# Patient Record
Sex: Male | Born: 1960 | Race: White | Hispanic: No | Marital: Married | Smoking: Never smoker
Health system: Southern US, Community
[De-identification: ages and names within clinical notes are randomized; demographics above are authoritative.]

## PROBLEM LIST (undated history)

## (undated) DIAGNOSIS — K5090 Crohn's disease, unspecified, without complications: Secondary | ICD-10-CM

## (undated) HISTORY — PX: ABDOMINAL SURGERY: SHX537

---

## 2004-06-23 ENCOUNTER — Encounter (INDEPENDENT_AMBULATORY_CARE_PROVIDER_SITE_OTHER): Payer: Self-pay | Admitting: Specialist

## 2004-06-23 ENCOUNTER — Ambulatory Visit (HOSPITAL_COMMUNITY): Admission: RE | Admit: 2004-06-23 | Discharge: 2004-06-23 | Payer: Self-pay | Admitting: Gastroenterology

## 2004-07-14 ENCOUNTER — Ambulatory Visit (HOSPITAL_COMMUNITY): Admission: RE | Admit: 2004-07-14 | Discharge: 2004-07-14 | Payer: Self-pay | Admitting: *Deleted

## 2004-10-15 ENCOUNTER — Encounter (HOSPITAL_COMMUNITY): Admission: RE | Admit: 2004-10-15 | Discharge: 2005-01-13 | Payer: Self-pay | Admitting: Gastroenterology

## 2005-01-14 ENCOUNTER — Encounter (HOSPITAL_COMMUNITY): Admission: RE | Admit: 2005-01-14 | Discharge: 2005-04-14 | Payer: Self-pay | Admitting: Gastroenterology

## 2005-06-29 ENCOUNTER — Encounter (HOSPITAL_COMMUNITY): Admission: RE | Admit: 2005-06-29 | Discharge: 2005-09-27 | Payer: Self-pay | Admitting: Gastroenterology

## 2005-09-30 ENCOUNTER — Encounter (HOSPITAL_COMMUNITY): Admission: RE | Admit: 2005-09-30 | Discharge: 2005-12-29 | Payer: Self-pay | Admitting: Gastroenterology

## 2006-03-12 ENCOUNTER — Encounter (HOSPITAL_COMMUNITY): Admission: RE | Admit: 2006-03-12 | Discharge: 2006-06-10 | Payer: Self-pay | Admitting: Gastroenterology

## 2006-08-23 ENCOUNTER — Encounter (HOSPITAL_COMMUNITY): Admission: RE | Admit: 2006-08-23 | Discharge: 2006-08-23 | Payer: Self-pay | Admitting: Gastroenterology

## 2006-10-25 ENCOUNTER — Encounter (HOSPITAL_COMMUNITY): Admission: RE | Admit: 2006-10-25 | Discharge: 2006-12-15 | Payer: Self-pay | Admitting: Nephrology

## 2007-01-18 ENCOUNTER — Encounter (HOSPITAL_COMMUNITY): Admission: RE | Admit: 2007-01-18 | Discharge: 2007-01-18 | Payer: Self-pay | Admitting: Gastroenterology

## 2007-03-12 ENCOUNTER — Emergency Department (HOSPITAL_COMMUNITY): Admission: EM | Admit: 2007-03-12 | Discharge: 2007-03-12 | Payer: Self-pay | Admitting: Emergency Medicine

## 2007-04-12 ENCOUNTER — Encounter (HOSPITAL_COMMUNITY): Admission: RE | Admit: 2007-04-12 | Discharge: 2007-04-12 | Payer: Self-pay | Admitting: Gastroenterology

## 2007-07-05 ENCOUNTER — Encounter (HOSPITAL_COMMUNITY): Admission: RE | Admit: 2007-07-05 | Discharge: 2007-09-14 | Payer: Self-pay | Admitting: Gastroenterology

## 2007-09-27 ENCOUNTER — Encounter (HOSPITAL_COMMUNITY): Admission: RE | Admit: 2007-09-27 | Discharge: 2007-11-16 | Payer: Self-pay | Admitting: Gastroenterology

## 2007-12-14 ENCOUNTER — Encounter (HOSPITAL_COMMUNITY): Admission: RE | Admit: 2007-12-14 | Discharge: 2008-01-12 | Payer: Self-pay | Admitting: Gastroenterology

## 2008-03-16 ENCOUNTER — Encounter (HOSPITAL_COMMUNITY): Admission: RE | Admit: 2008-03-16 | Discharge: 2008-06-14 | Payer: Self-pay | Admitting: Gastroenterology

## 2008-07-17 ENCOUNTER — Encounter (HOSPITAL_COMMUNITY): Admission: RE | Admit: 2008-07-17 | Discharge: 2008-10-05 | Payer: Self-pay | Admitting: *Deleted

## 2008-10-17 ENCOUNTER — Encounter: Admission: RE | Admit: 2008-10-17 | Discharge: 2009-01-15 | Payer: Self-pay | Admitting: Gastroenterology

## 2009-01-17 ENCOUNTER — Encounter (HOSPITAL_COMMUNITY): Admission: RE | Admit: 2009-01-17 | Discharge: 2009-04-17 | Payer: Self-pay | Admitting: *Deleted

## 2009-04-30 ENCOUNTER — Encounter (HOSPITAL_COMMUNITY): Admission: RE | Admit: 2009-04-30 | Discharge: 2009-07-29 | Payer: Self-pay | Admitting: Gastroenterology

## 2009-07-30 ENCOUNTER — Encounter (HOSPITAL_COMMUNITY)
Admission: RE | Admit: 2009-07-30 | Discharge: 2009-10-28 | Payer: Self-pay | Source: Home / Self Care | Admitting: Gastroenterology

## 2010-01-15 ENCOUNTER — Encounter (HOSPITAL_COMMUNITY)
Admission: RE | Admit: 2010-01-15 | Discharge: 2010-02-11 | Payer: Self-pay | Source: Home / Self Care | Attending: Gastroenterology | Admitting: Gastroenterology

## 2010-02-28 ENCOUNTER — Emergency Department (HOSPITAL_COMMUNITY): Payer: Self-pay

## 2010-02-28 ENCOUNTER — Inpatient Hospital Stay (HOSPITAL_COMMUNITY)
Admission: EM | Admit: 2010-02-28 | Discharge: 2010-03-06 | DRG: 386 | Disposition: A | Discharge status: Home / Self Care | Payer: Self-pay | Attending: Internal Medicine | Admitting: Internal Medicine

## 2010-02-28 DIAGNOSIS — K5000 Crohn's disease of small intestine without complications: Principal | ICD-10-CM | POA: Diagnosis present

## 2010-02-28 DIAGNOSIS — G4700 Insomnia, unspecified: Secondary | ICD-10-CM | POA: Diagnosis present

## 2010-02-28 DIAGNOSIS — F411 Generalized anxiety disorder: Secondary | ICD-10-CM | POA: Diagnosis present

## 2010-02-28 DIAGNOSIS — K56609 Unspecified intestinal obstruction, unspecified as to partial versus complete obstruction: Secondary | ICD-10-CM | POA: Diagnosis present

## 2010-02-28 DIAGNOSIS — M1990 Unspecified osteoarthritis, unspecified site: Secondary | ICD-10-CM | POA: Diagnosis present

## 2010-02-28 LAB — CBC
MCH: 31.1 pg (ref 26.0–34.0)
MCHC: 34.2 g/dL (ref 30.0–36.0)
Platelets: 198 10*3/uL (ref 150–400)
RDW: 13.4 % (ref 11.5–15.5)

## 2010-02-28 LAB — COMPREHENSIVE METABOLIC PANEL
AST: 16 U/L (ref 0–37)
Albumin: 3.5 g/dL (ref 3.5–5.2)
Calcium: 9 mg/dL (ref 8.4–10.5)
Creatinine, Ser: 0.92 mg/dL (ref 0.4–1.5)
GFR calc Af Amer: >60 mL/min (ref >60)
GFR calc non Af Amer: >60 mL/min (ref >60)
Sodium: 141 mEq/L (ref 135–145)
Total Protein: 6.8 g/dL (ref 6.0–8.3)

## 2010-02-28 LAB — DIFFERENTIAL
Basophils Absolute: 0 10*3/uL (ref 0.0–0.1)
Basophils Relative: 0 % (ref 0–1)
Eosinophils Absolute: 0 10*3/uL (ref 0.0–0.7)
Monocytes Absolute: 0.9 10*3/uL (ref 0.1–1.0)
Monocytes Relative: 7 % (ref 3–12)

## 2010-02-28 MED ORDER — IOHEXOL 300 MG/ML  SOLN: 100.0000 mL | Freq: Once | INTRAMUSCULAR | Status: AC | PRN

## 2010-03-01 LAB — COMPREHENSIVE METABOLIC PANEL
ALT: 15 U/L (ref 0–53)
Alkaline Phosphatase: 48 U/L (ref 39–117)
CO2: 27 mEq/L (ref 19–32)
Calcium: 8.6 mg/dL (ref 8.4–10.5)
GFR calc non Af Amer: >60 mL/min (ref >60)
Glucose, Bld: 102 mg/dL — ABNORMAL HIGH (ref 70–99)
Sodium: 140 mEq/L (ref 135–145)

## 2010-03-01 LAB — CBC
HCT: 42.4 % (ref 39.0–52.0)
Hemoglobin: 14 g/dL (ref 13.0–17.0)
MCHC: 33 g/dL (ref 30.0–36.0)
MCV: 91.6 fL (ref 78.0–100.0)

## 2010-03-01 LAB — DIFFERENTIAL
Basophils Absolute: 0 10*3/uL (ref 0.0–0.1)
Lymphocytes Relative: 27 % (ref 12–46)
Lymphs Abs: 3 10*3/uL (ref 0.7–4.0)
Monocytes Absolute: 0.9 10*3/uL (ref 0.1–1.0)
Monocytes Relative: 8 % (ref 3–12)
Neutro Abs: 7.3 10*3/uL (ref 1.7–7.7)

## 2010-03-01 LAB — MAGNESIUM: Magnesium: 2.1 mg/dL (ref 1.5–2.5)

## 2010-03-02 ENCOUNTER — Inpatient Hospital Stay (HOSPITAL_COMMUNITY): Payer: Self-pay

## 2010-03-02 LAB — CBC
HCT: 42 % (ref 39.0–52.0)
Hemoglobin: 14.2 g/dL (ref 13.0–17.0)
MCH: 30.3 pg (ref 26.0–34.0)
MCHC: 33.8 g/dL (ref 30.0–36.0)

## 2010-03-02 LAB — BASIC METABOLIC PANEL
CO2: 24 mEq/L (ref 19–32)
Calcium: 9.1 mg/dL (ref 8.4–10.5)
Creatinine, Ser: 0.99 mg/dL (ref 0.4–1.5)
Glucose, Bld: 115 mg/dL — ABNORMAL HIGH (ref 70–99)

## 2010-03-03 LAB — CBC
MCH: 30.2 pg (ref 26.0–34.0)
MCV: 89.6 fL (ref 78.0–100.0)
Platelets: 217 10*3/uL (ref 150–400)
RDW: 13 % (ref 11.5–15.5)

## 2010-03-03 LAB — BASIC METABOLIC PANEL
BUN: 11 mg/dL (ref 6–23)
Chloride: 108 mEq/L (ref 96–112)
Creatinine, Ser: 0.9 mg/dL (ref 0.4–1.5)
GFR calc non Af Amer: >60 mL/min (ref >60)

## 2010-03-04 LAB — BASIC METABOLIC PANEL WITH GFR
BUN: 11 mg/dL (ref 6–23)
CO2: 25 meq/L (ref 19–32)
Calcium: 9 mg/dL (ref 8.4–10.5)
Chloride: 107 meq/L (ref 96–112)
Creatinine, Ser: 0.94 mg/dL (ref 0.4–1.5)
GFR calc non Af Amer: >60 mL/min
Glucose, Bld: 144 mg/dL — ABNORMAL HIGH (ref 70–99)
Potassium: 3.7 meq/L (ref 3.5–5.1)
Sodium: 138 meq/L (ref 135–145)

## 2010-03-04 LAB — CBC
HCT: 40.4 % (ref 39.0–52.0)
Hemoglobin: 13.5 g/dL (ref 13.0–17.0)
MCH: 30.3 pg (ref 26.0–34.0)
MCHC: 33.4 g/dL (ref 30.0–36.0)
MCV: 90.6 fL (ref 78.0–100.0)
Platelets: 208 K/uL (ref 150–400)
RBC: 4.46 MIL/uL (ref 4.22–5.81)
RDW: 13.1 % (ref 11.5–15.5)
WBC: 14.5 K/uL — ABNORMAL HIGH (ref 4.0–10.5)

## 2010-03-05 LAB — BASIC METABOLIC PANEL
GFR calc Af Amer: >60 mL/min (ref >60)
GFR calc non Af Amer: >60 mL/min (ref >60)
Potassium: 4.1 mEq/L (ref 3.5–5.1)
Sodium: 144 mEq/L (ref 135–145)

## 2010-03-05 LAB — CBC
HCT: 40.4 % (ref 39.0–52.0)
Platelets: 193 10*3/uL (ref 150–400)
RDW: 12.9 % (ref 11.5–15.5)
WBC: 12.5 10*3/uL — ABNORMAL HIGH (ref 4.0–10.5)

## 2010-03-16 NOTE — H&P (Signed)
Isaac Mckenzie, Isaac Mckenzie                ACCOUNT NO.:  [id number]  MEDICAL RECORD NO.:  [id number]           PATIENT TYPE:  E  LOCATION:  [facility]                         FACILITY:  [facility]  PHYSICIAN:  Michiel Cowboy, MDDATE OF BIRTH:  December 15, 1960  DATE OF ADMISSION:  02/28/2010 DATE OF DISCHARGE:                             HISTORY & PHYSICAL   DATE OF ADMISSION:  February 28, 2010  PRIMARY CARE PROVIDER:  Otilio Connors. Gerri Spore, MD  CHIEF COMPLAINT:  Nausea, vomiting or abdominal pain.  HISTORY:  The patient is a 50 year old gentleman with a history of Crohn disease who has had recurrent small bowel obstructions in the past, has had required 2 surgeries to release small bowel obstruction, both times small part of his small bowel were removed.  For the past few days, he had been having worsening symptoms of abdominal pain, decreased bowel movements.  Actually, he feels like he has not had any bowel movements in the past 3 days.  He also have had decreased p.o. intake, nausea and vomiting.  He was not able to take anything to drink or eat.  REVIEW OF SYSTEMS:  The patient denies any fevers or chills.  No current chest pains, although he states that rarely he gets occasional chest pains, which he cannot really qualify.  He has otherwise review of systems only significant for HPI.  SOCIAL HISTORY:  The patient is a former smoke.  Does not drink or abuse drugs.  FAMILY HISTORY:  Noncontributory.  ALLERGIES:  IBUPROFEN and MORPHINE causes hives.  PAST MEDICAL HISTORY:  Significant for: 1. Crohn's. 2. Neck arthritis. 3. 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  MEDICATIONS: 1. Hydrocodone/acetaminophen as needed for pain. 2. Clonazepam 0.5 mg at night. 3. Remicade, he gets it every 3 months. 4. Pentasa.  He is supposed to take 2 caps 500 mg each 4 times a day,     only takes it when he has flares because he does not have enough     insurance to  pay for it. 5. He also has prescription for Flagyl as needed for when he has     flares.  PHYSICAL EXAMINATION:  VITALS:  Temperature 98.5, blood pressure 117/92, pulse 79, respiration 20, satting 97% on room air. GENERAL:  The patient appears to be in no acute distress. HEENT:  Head; nontraumatic.  Moist mucous membranes. LUNGS:  Clear to auscultation bilaterally. HEART:  Regular rate rhythm.  No murmurs appreciated. ABDOMEN:  Some central tenderness present.  No rebound tenderness noted. Decreased bowel movements throughout. LOWER EXTREMITIES:  Without clubbing, cyanosis, or edema. NEUROLOGIC:  Grossly intact.  LABORATORY DATA:  White blood cell count 11.9, hemoglobin 14.4.  Sodium 141, potassium 3.8, creatinine 0.92.  LFTs within normal limits.  Lipase within normal limits.  KUB showing mild small bowel obstruction.  ASSESSMENT AND PLAN:  This is a pleasant 50 year old gentleman with partial small bowel obstruction and a history of Crohn disease.  Partial small bowel obstruction.  We will admit.  We will also order a CT scan of abdomen and pelvis try  to clarify this a little bit better. He probably will need at some point surgical consult, especially if this does not get better with just conservative management as he has required surgery in the past.  Right now, we will do a bowel rest and pain management.  History of Crohn's.  I am not sure if he has some Crohn's exacerbation component to it.  Right now, we will cover with Cipro and Flagyl.  Prophylaxis, Protonix and SCDs.     Michiel Cowboy, MD     AVD/MEDQ  D:  02/28/2010  T:  02/28/2010  Job:  [id number]  cc:   Otilio Connors. Gerri Spore, M.D. _____________ Dr. Randa Evens  Electronically Signed by Therisa Doyne MD on 03/16/2010 10:08:44 PM

## 2010-03-26 ENCOUNTER — Encounter (HOSPITAL_COMMUNITY): Payer: Self-pay

## 2010-03-28 NOTE — Op Note (Signed)
  NAMEPELHAM, HENNICK                ACCOUNT NO.:  [id number]  MEDICAL RECORD NO.:  [id number]           PATIENT TYPE:  LOCATION:                                 FACILITY:  PHYSICIAN:  Victoriana Aziz L. Malon Kindle., M.D.DATE OF BIRTH:  [birth year]  DATE OF PROCEDURE:  03/05/2010 DATE OF DISCHARGE:                              OPERATIVE REPORT   PROCEDURE:  Colonoscopy.  MEDICATIONS: 1. Benadryl 50 mg. 2. Fentanyl 150 mcg. 3. Versed 12 mg IV.  INDICATIONS:  A 50 year old gentleman with a long history of Crohn's, has had some sort of surgery.  Colonoscopy a number of years ago showed what appeared to be an inflammatory mass.  It was not clear if this was ileocecal valve or what.  He has not had a colonoscopy in the past several years.  He was admitted with a CT showing active small bowel Crohn's that is slowly improving, this is done to look for activity in the ileocecal area.  DESCRIPTION OF PROCEDURE:  Procedure explained to the patient, consent obtained.  In the left lateral decubitus position, the Pentax scope was inserted and advanced.  We easily advanced to the right colon. At this point, there was no active Crohn's.  There were a few scattered diverticula.  The anastomosis was clearly seen, and there was no evidence of an inflammatory mass or ileocecal valve.  It had the appearance of end-to-side ileocolonic anastomosis.  At this point, I suspect he has had at least a partial right colectomy.  I tried to get through this area but it was somewhat narrowed, but we were unable to pass the scope.  It was not inflamed.  Gentle pressure were unable to pass but it did appear to be patent.  The scope was withdrawn and mucosa carefully examined.  Other than a few scattered diverticula, there was no active Crohn's at all throughout the colon.  The patient tolerated the procedure well.  ASSESSMENT:  No active Crohn disease in the colon.  The CT suggests small bowel Crohn's.  The patient  apparently has had a previous right colectomy with an ileocolonic anastomosis.  PLAN:  We will slowly advance diet.  Hopefully, we will be able to discharge him on oral medications with chronic IV Remicade.          ______________________________ Llana Aliment. Malon Kindle., M.D.     Waldron Session  D:  03/05/2010  T:  03/05/2010  Job:  [id number]  Electronically Signed by Carman Ching M.D. on 03/28/2010 09:25:28 AM

## 2010-04-02 NOTE — Discharge Summary (Signed)
Isaac Mckenzie, Isaac Mckenzie                ACCOUNT NO.:  [id number]  MEDICAL RECORD NO.:  [id number]           PATIENT TYPE:  I  LOCATION:  5149                         FACILITY:  [facility]  PHYSICIAN:  Isaac Llano, MD       DATE OF BIRTH:  30-Mar-1960  DATE OF ADMISSION:  02/28/2010 DATE OF DISCHARGE:  03/06/2010                              DISCHARGE SUMMARY   PRIMARY CARE PHYSICIAN:  Carola J. Gerri Spore, MD  PRIMARY GASTROENTEROLOGIST:  Isaac Aliment. Randa Evens, MD  REASON FOR ADMISSION:  Nausea, vomiting, abdominal pain.  DISCHARGE DIAGNOSES: 1. Crohn disease exacerbation with terminal ileitis. 2. Partial small bowel obstruction, resolved, secondary to Crohn     disease flare-up. 3. Anxiety. 4. Insomnia, resolved. 5. Neck arthritis. 6. History of recurrent small bowel obstruction and history of     fistulae.  DISCHARGE MEDICATIONS: 1. Xanax 0.5 mg by mouth b.i.d. as needed for anxiety. 2. Prednisone 10 mg, take 3 tablets p.o. b.i.d. 3. Clonazepam 0.5 to 1 mg daily at bedtime as needed to help with     sleep. 4. Fish oil OTC 1 tablet every morning as needed. 5. Hydrocodone/APAP 10/500 mg 1 tablet p.o. b.i.d. as needed for pain. 6. Metronidazole one tablet 3 daily as needed with Crohn disease flare-     ups 7. Pentasa 500 mg 2 capsules p.o. b.i.d. 8. Remicade IV every 3 months.  RADIOLOGY: 1. Abdominal x-ray, March 02, 2010, showed decrease in caliber in     number of previously dilated mid bowel.  Abdominal loops suggest     improvement of small bowel obstruction. 2. CT scan of abdomen and pelvis, February 28, 2010, showed wall     thickening within the terminal ileum, dilated mid and distal small     bowel loops compatible with associated mild small bowel     obstruction.  There was sigmoid diverticulosis. 3. Colonoscopy done Dr. Carman Mckenzie on March 05, 2010, showed no     active Crohn disease in the colon.  The CT suggests small bowel     Crohn's.  The patient apparently  had previous right colectomy with     ileocolonic anastomosis.  Continue on chronic IV Remicade, oral     Pentasa, and advance diet.  BRIEF HISTORY EXAMINATION:  Isaac Mckenzie is a 50 year old gentleman with a history of colon disease who had recurrent small bowel obstruction in the past secondary to Crohn disease exacerbation.  The patient required two surgeries to release small bowel obstruction, both times parts of small bowel were removed.  For the past few days prior to admission, he had been having worsening of symptoms of abdominal pain, decreased bowel movement, actually feel like he has not had bowel movement in the past 3 days prior to admission.  He also had decreased p.o. intake, nausea, vomiting.  The patient admitted to the hospital for further evaluation.  BRIEF HOSPITAL COURSE: 1. Crohn disease exacerbation.  The patient admitted to the hospital.     GI was consulted and the patient was started on his IV Remicade as     well as IV steroids.  Soon that was switched to oral steroids.  Dr.     Randa Mckenzie as mentioned above did colonoscopy which showed no active     Crohn disease in the colon and recommended to switch to oral     steroids and advance as tolerated.  The patient's small bowel     obstruction was getting better and the patient seems safe to go     home after he was eating and small bowel obstruction resolved. 2. Dr. Randa Mckenzie recommend to continue on the Pentasa as well as chronic     Remicade.  Recommended to continue on 30 mg of prednisone b.i.d.     until he sees him in his office. 3. Partial small bowel obstruction, resolved.  The patient was n.p.o.     at the time he came in.  Because of previous small bowel     obstruction that required surgery, it was thought that he is going     to need surgery but he did recover beautifully.  The patient having     bowel movement and his diet was advanced and he did have recovery     from that.  The patient on date of  discharge eating low-residue     diet without any problems, having bowel movement without any     nausea, vomiting, or abdominal cramps. 4. Anxiety.  The patient taking Ativan orally for insomnia, not for     anxiety.  He has a long history of anxiety, so long-acting     benzodiazepine was added.  DISCHARGE INSTRUCTIONS: 1. Activity:  As tolerated. 2. Diet:  Low-residue diet. 3. Disposition:  Home.     Isaac Llano, MD     ME/MEDQ  D:  03/06/2010  T:  03/07/2010  Job:  [id number]  cc:   Isaac Mckenzie, M.D. Isaac Mckenzie., M.D.  Electronically Signed by Isaac Mckenzie  on 04/02/2010 07:47:37 PM

## 2010-04-25 ENCOUNTER — Encounter (HOSPITAL_COMMUNITY): Payer: Self-pay | Attending: Gastroenterology

## 2010-04-25 DIAGNOSIS — K5090 Crohn's disease, unspecified, without complications: Secondary | ICD-10-CM | POA: Insufficient documentation

## 2010-05-30 NOTE — Op Note (Signed)
NAMECORDARRYL, MONRREAL                ACCOUNT NO.:  [id number]   MEDICAL RECORD NO.:  [id number]          PATIENT TYPE:  AMB   LOCATION:  DAY                          FACILITY[facility]  [facility]   PHYSICIAN:  Vikki Ports, MDDATE OF BIRTH:  1960/03/12   DATE OF PROCEDURE:  07/14/2004  DATE OF DISCHARGE:                                 OPERATIVE REPORT   PREOPERATIVE DIAGNOSIS:  History of Crohn's disease and fistula in ano.   POSTOPERATIVE DIAGNOSIS:  History of Crohn's disease and fistula in ano,  transsphincteric.   SURGEON:  Vikki Ports, MD.   PROCEDURE:  Placement of a Surgisis fistula plug and examination under  anesthesia.   ANESTHESIA:  General.   DESCRIPTION:  The patient was taken to the operating room and placed in  supine position.  After adequate general anesthesia was induced, the patient  was placed in lithotomy position, and perianal prep and rectal prep were  undertaken.  A genitorectal probe at the external opening approximately 5-6  cm from the anal verge.  The track to the internal opening was visualized.  It traversed both the internal and external sphincter muscles.  The second  opening closer to the anus did not have an external opening and appeared to  be only fibrous tissue consistent with scar.  A Surgisis plug was then  soaked in saline.  Silk suture was passed around it, and it was looped  around the suture passer which was placed through the fistula track.  It was  brought out to the skin edge and sutured intact to the edge of the skin with  a 3-0 Vicryl.  The mucosa and submucosal were closed over the internal edge  using a figure-of-eight 2-0 Vicryl suture.  Gelfoam packing was placed.  The  remainder of the exam was normal.  The patient tolerated the procedure well  and went to PACU in good condition.       KRH/MEDQ  D:  07/14/2004  T:  07/14/2004  Job:  [id number]

## 2010-05-30 NOTE — Op Note (Signed)
Isaac Mckenzie, Isaac Mckenzie                ACCOUNT NO.:  [id number]   MEDICAL RECORD NO.:  [id number]          PATIENT TYPE:  AMB   LOCATION:  ENDO                         FACILITY:  [facility]   PHYSICIAN:  James L. Malon Kindle., M.D.DATE OF BIRTH:  01/25/1960   DATE OF PROCEDURE:  06/23/2004  DATE OF DISCHARGE:                                 OPERATIVE REPORT   PROCEDURE:  Colonoscopy with biopsy.   MEDICATIONS:  Fentanyl 200 micrograms, Versed 10 mg, Phenergan 25 mg IV.   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   DESCRIPTION OF PROCEDURE:  The procedure was explained to the patient and  consent was obtained.  In the left lateral decubitus position, the patient  was examined.  He has two fistulous tracts, one in the immediate anal area,  the other to the mid of the left buttocks, both currently bandaged.  Digital  exam was performed and he did have some tender perianal disease.  Pediatric  adjustable scope was inserted into the anus where it was easily advanced to  the right colon, what appeared to be the cecum.  There was an ulcerated area  seen right at what appeared to be the ileocecal valve versus anastomosis.  It was deformed and ulcerated and was biopsied.  This was consistent with  Crohn's disease.  No other abnormalities were seen in the colon.  The scope  was withdrawn and the mucosa throughout the entire remainder of the colon  was normal.  The scope was withdrawn down to the rectum and retroflexed.  There were no fistulous connections seen in the rectum.  The scope was  withdrawn.  The patient tolerated the procedure well.   ASSESSMENT:  1.  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  2.  No obvious rectal fistula seen.   PLAN:  Will check path.  Will instruct the patient on Pentasa 250 mg four  tablets q.i.d., see  back in the office in three weeks.  In the interim will  try to obtain a small bowel series.       JLE/MEDQ  D:  06/23/2004  T:  06/23/2004  Job:  [id number]   cc:   Vikki Ports, MD  [address]  [place]  [place] [address]   Ernestina Penna, M.D.  [address] [place]  [place] [address]  Fax: [phone]

## 2010-06-20 ENCOUNTER — Encounter (HOSPITAL_COMMUNITY): Payer: Self-pay

## 2010-06-24 ENCOUNTER — Encounter (HOSPITAL_COMMUNITY): Payer: Self-pay | Attending: Gastroenterology

## 2010-06-24 DIAGNOSIS — K5090 Crohn's disease, unspecified, without complications: Secondary | ICD-10-CM | POA: Insufficient documentation

## 2010-07-10 ENCOUNTER — Other Ambulatory Visit: Payer: Self-pay | Admitting: Gastroenterology

## 2010-07-10 DIAGNOSIS — K5090 Crohn's disease, unspecified, without complications: Secondary | ICD-10-CM

## 2010-07-14 ENCOUNTER — Other Ambulatory Visit: Payer: Self-pay

## 2010-08-14 ENCOUNTER — Other Ambulatory Visit: Payer: Self-pay

## 2010-08-19 ENCOUNTER — Encounter (HOSPITAL_COMMUNITY): Payer: Self-pay | Attending: Gastroenterology

## 2010-08-19 DIAGNOSIS — K5090 Crohn's disease, unspecified, without complications: Secondary | ICD-10-CM | POA: Insufficient documentation

## 2010-10-03 LAB — DIFFERENTIAL
Eosinophils Absolute: 0
Eosinophils Relative: 0
Lymphocytes Relative: 8 — ABNORMAL LOW
Lymphs Abs: 0.5 — ABNORMAL LOW
Monocytes Absolute: 0.7

## 2010-10-03 LAB — BASIC METABOLIC PANEL
BUN: 5 — ABNORMAL LOW
Chloride: 101
GFR calc non Af Amer: >60
Glucose, Bld: 98
Potassium: 3.7
Sodium: 134 — ABNORMAL LOW

## 2010-10-03 LAB — CBC
HCT: 41.9
Hemoglobin: 14.4
MCV: 91
Platelets: 179
WBC: 6.3

## 2010-10-03 LAB — INFLUENZA A+B VIRUS AG-DIRECT(RAPID)
Inflenza A Ag: POSITIVE — AB
Influenza B Ag: NEGATIVE

## 2010-10-14 ENCOUNTER — Encounter (HOSPITAL_COMMUNITY)
Admission: RE | Admit: 2010-10-14 | Discharge: 2010-10-14 | Disposition: A | Discharge status: Home / Self Care | Payer: 59 | Source: Ambulatory Visit | Attending: Gastroenterology | Admitting: Gastroenterology

## 2010-10-14 DIAGNOSIS — K5090 Crohn's disease, unspecified, without complications: Secondary | ICD-10-CM | POA: Insufficient documentation

## 2010-12-09 ENCOUNTER — Encounter (HOSPITAL_COMMUNITY)
Admission: RE | Admit: 2010-12-09 | Discharge: 2010-12-09 | Disposition: A | Discharge status: Home / Self Care | Payer: 59 | Source: Ambulatory Visit | Attending: Gastroenterology | Admitting: Gastroenterology

## 2010-12-09 DIAGNOSIS — K5090 Crohn's disease, unspecified, without complications: Secondary | ICD-10-CM | POA: Insufficient documentation

## 2010-12-09 MED ORDER — ACETAMINOPHEN 500 MG PO TABS
1000.0000 mg | ORAL_TABLET | ORAL | Status: DC
  Administered 2010-12-09: 1000 mg via ORAL
  Filled 2010-12-09: qty 2

## 2010-12-09 MED ORDER — DIPHENHYDRAMINE HCL 25 MG PO CAPS
ORAL_CAPSULE | ORAL | Status: AC
  Filled 2010-12-09: qty 2

## 2010-12-09 MED ORDER — DIPHENHYDRAMINE HCL 25 MG PO TABS: 50.0000 mg | ORAL_TABLET | ORAL | Status: DC

## 2010-12-09 MED ORDER — DIPHENHYDRAMINE HCL 50 MG/ML IJ SOLN
INTRAMUSCULAR | Status: AC
  Administered 2010-12-09: 25 mg via INTRAVENOUS
  Filled 2010-12-09: qty 1

## 2010-12-09 MED ORDER — DIPHENHYDRAMINE HCL 50 MG/ML IJ SOLN: 50.0000 mg | INTRAMUSCULAR | Status: DC

## 2010-12-09 MED ORDER — SODIUM CHLORIDE 0.9 % IV SOLN
500.0000 mg | INTRAVENOUS | Status: DC
  Administered 2010-12-09: 500 mg via INTRAVENOUS
  Filled 2010-12-09: qty 50

## 2010-12-09 MED ORDER — DIPHENHYDRAMINE HCL 50 MG/ML IJ SOLN
25.0000 mg | INTRAMUSCULAR | Status: DC
Start: 2010-12-09 — End: 2010-12-10
  Administered 2010-12-09: 25 mg via INTRAVENOUS

## 2011-02-02 ENCOUNTER — Encounter (HOSPITAL_COMMUNITY): Payer: 59

## 2011-02-12 ENCOUNTER — Other Ambulatory Visit (HOSPITAL_COMMUNITY): Payer: Self-pay | Admitting: *Deleted

## 2011-02-12 MED ORDER — DIPHENHYDRAMINE HCL 50 MG/ML IJ SOLN: 50.0000 mg | Freq: Once | INTRAMUSCULAR | Status: DC

## 2011-02-16 ENCOUNTER — Inpatient Hospital Stay (HOSPITAL_COMMUNITY): Admission: RE | Admit: 2011-02-16 | Payer: 59 | Source: Ambulatory Visit

## 2011-02-17 ENCOUNTER — Encounter (HOSPITAL_COMMUNITY): Payer: 59

## 2011-02-26 ENCOUNTER — Encounter (HOSPITAL_COMMUNITY)
Admission: RE | Admit: 2011-02-26 | Discharge: 2011-02-26 | Disposition: A | Discharge status: Home / Self Care | Payer: 59 | Source: Ambulatory Visit | Attending: Gastroenterology | Admitting: Gastroenterology

## 2011-02-26 DIAGNOSIS — K5090 Crohn's disease, unspecified, without complications: Secondary | ICD-10-CM | POA: Insufficient documentation

## 2011-02-26 MED ORDER — ACETAMINOPHEN 500 MG PO TABS
ORAL_TABLET | ORAL | Status: AC
  Administered 2011-02-26: 1000 mg via ORAL
  Filled 2011-02-26: qty 2

## 2011-02-26 MED ORDER — SODIUM CHLORIDE 0.9 % IV SOLN
500.0000 mg | INTRAVENOUS | Status: AC
  Administered 2011-02-26: 500 mg via INTRAVENOUS
  Filled 2011-02-26: qty 50

## 2011-02-26 MED ORDER — DIPHENHYDRAMINE HCL 50 MG/ML IJ SOLN
INTRAMUSCULAR | Status: AC
  Administered 2011-02-26: 25 mg via INTRAVENOUS
  Filled 2011-02-26: qty 1

## 2011-02-26 MED ORDER — ACETAMINOPHEN 500 MG PO TABS
1000.0000 mg | ORAL_TABLET | Freq: Once | ORAL | Status: AC
  Administered 2011-02-26: 1000 mg via ORAL

## 2011-02-26 MED ORDER — DIPHENHYDRAMINE HCL 50 MG/ML IJ SOLN
25.0000 mg | Freq: Once | INTRAMUSCULAR | Status: AC
  Administered 2011-02-26: 25 mg via INTRAVENOUS

## 2011-04-27 ENCOUNTER — Other Ambulatory Visit (HOSPITAL_COMMUNITY): Payer: Self-pay | Admitting: *Deleted

## 2011-04-30 ENCOUNTER — Encounter (HOSPITAL_COMMUNITY)
Admission: RE | Admit: 2011-04-30 | Discharge: 2011-04-30 | Disposition: A | Discharge status: Home / Self Care | Payer: 59 | Source: Ambulatory Visit | Attending: Gastroenterology | Admitting: Gastroenterology

## 2011-04-30 DIAGNOSIS — K5090 Crohn's disease, unspecified, without complications: Secondary | ICD-10-CM | POA: Insufficient documentation

## 2011-04-30 MED ORDER — ACETAMINOPHEN 500 MG PO TABS
1000.0000 mg | ORAL_TABLET | ORAL | Status: DC
  Administered 2011-04-30: 1000 mg via ORAL
  Filled 2011-04-30: qty 2

## 2011-04-30 MED ORDER — SODIUM CHLORIDE 0.9 % IV SOLN
INTRAVENOUS | Status: DC
  Administered 2011-04-30: 11:00:00 via INTRAVENOUS

## 2011-04-30 MED ORDER — DIPHENHYDRAMINE HCL 50 MG/ML IJ SOLN
50.0000 mg | INTRAMUSCULAR | Status: DC
  Filled 2011-04-30: qty 1

## 2011-04-30 MED ORDER — DIPHENHYDRAMINE HCL 50 MG/ML IJ SOLN
37.5000 mg | INTRAMUSCULAR | Status: DC
Start: 2011-04-30 — End: 2011-05-01
  Administered 2011-04-30: 37.5 mg via INTRAVENOUS

## 2011-04-30 MED ORDER — INFLIXIMAB 100 MG IV SOLR
800.0000 mg | INTRAVENOUS | Status: DC
  Administered 2011-04-30: 800 mg via INTRAVENOUS
  Filled 2011-04-30: qty 80

## 2011-07-09 ENCOUNTER — Encounter (HOSPITAL_COMMUNITY): Payer: 59

## 2011-07-20 ENCOUNTER — Encounter (HOSPITAL_COMMUNITY)
Admission: RE | Admit: 2011-07-20 | Discharge: 2011-07-20 | Disposition: A | Discharge status: Home / Self Care | Payer: 59 | Source: Ambulatory Visit | Attending: Gastroenterology | Admitting: Gastroenterology

## 2011-07-20 DIAGNOSIS — K5090 Crohn's disease, unspecified, without complications: Secondary | ICD-10-CM | POA: Insufficient documentation

## 2011-07-20 MED ORDER — SODIUM CHLORIDE 0.9 % IV SOLN
8.0000 mg/kg | INTRAVENOUS | Status: AC
  Administered 2011-07-20: 700 mg via INTRAVENOUS
  Filled 2011-07-20: qty 70

## 2011-07-20 MED ORDER — DIPHENHYDRAMINE HCL 50 MG/ML IJ SOLN
50.0000 mg | Freq: Once | INTRAMUSCULAR | Status: AC
  Administered 2011-07-20: 50 mg via INTRAVENOUS
  Filled 2011-07-20: qty 1

## 2011-07-20 MED ORDER — ACETAMINOPHEN 325 MG PO TABS
650.0000 mg | ORAL_TABLET | Freq: Once | ORAL | Status: AC
  Administered 2011-07-20: 650 mg via ORAL
  Filled 2011-07-20: qty 2

## 2011-07-20 MED ORDER — SODIUM CHLORIDE 0.9 % IV SOLN
Freq: Once | INTRAVENOUS | Status: AC
  Administered 2011-07-20: 11:00:00 via INTRAVENOUS

## 2011-09-25 ENCOUNTER — Other Ambulatory Visit (HOSPITAL_COMMUNITY): Payer: Self-pay | Admitting: *Deleted

## 2011-09-28 ENCOUNTER — Encounter (HOSPITAL_COMMUNITY)
Admission: RE | Admit: 2011-09-28 | Discharge: 2011-09-28 | Disposition: A | Discharge status: Home / Self Care | Payer: 59 | Source: Ambulatory Visit | Attending: Gastroenterology | Admitting: Gastroenterology

## 2011-09-28 DIAGNOSIS — K5090 Crohn's disease, unspecified, without complications: Secondary | ICD-10-CM | POA: Insufficient documentation

## 2011-09-28 MED ORDER — ACETAMINOPHEN 500 MG PO TABS
1000.0000 mg | ORAL_TABLET | Freq: Once | ORAL | Status: AC
  Administered 2011-09-28: 1000 mg via ORAL
  Filled 2011-09-28: qty 2

## 2011-09-28 MED ORDER — SODIUM CHLORIDE 0.9 % IV SOLN
INTRAVENOUS | Status: DC
  Administered 2011-09-28: 11:00:00 via INTRAVENOUS

## 2011-09-28 MED ORDER — SODIUM CHLORIDE 0.9 % IV SOLN
800.0000 mg | INTRAVENOUS | Status: DC
  Administered 2011-09-28: 800 mg via INTRAVENOUS
  Filled 2011-09-28: qty 80

## 2011-09-28 MED ORDER — DIPHENHYDRAMINE HCL 50 MG/ML IJ SOLN
50.0000 mg | Freq: Once | INTRAMUSCULAR | Status: AC
  Administered 2011-09-28: 50 mg via INTRAVENOUS
  Filled 2011-09-28: qty 1

## 2011-11-30 ENCOUNTER — Encounter (HOSPITAL_COMMUNITY): Payer: 59

## 2011-12-08 ENCOUNTER — Encounter (HOSPITAL_COMMUNITY): Payer: 59

## 2011-12-14 ENCOUNTER — Other Ambulatory Visit (HOSPITAL_COMMUNITY): Payer: Self-pay | Admitting: *Deleted

## 2011-12-15 ENCOUNTER — Encounter (HOSPITAL_COMMUNITY)
Admission: RE | Admit: 2011-12-15 | Discharge: 2011-12-15 | Disposition: A | Discharge status: Home / Self Care | Payer: 59 | Source: Ambulatory Visit | Attending: Gastroenterology | Admitting: Gastroenterology

## 2011-12-15 DIAGNOSIS — K5090 Crohn's disease, unspecified, without complications: Secondary | ICD-10-CM | POA: Insufficient documentation

## 2011-12-15 MED ORDER — DIPHENHYDRAMINE HCL 50 MG/ML IJ SOLN
INTRAMUSCULAR | Status: AC
  Administered 2011-12-15: 50 mg via INTRAVENOUS
  Filled 2011-12-15: qty 1

## 2011-12-15 MED ORDER — ACETAMINOPHEN 500 MG PO TABS
ORAL_TABLET | ORAL | Status: AC
  Administered 2011-12-15: 1000 mg via ORAL
  Filled 2011-12-15: qty 2

## 2011-12-15 MED ORDER — SODIUM CHLORIDE 0.9 % IV SOLN
800.0000 mg | INTRAVENOUS | Status: AC
  Administered 2011-12-15: 800 mg via INTRAVENOUS
  Filled 2011-12-15: qty 80

## 2011-12-15 MED ORDER — ACETAMINOPHEN 500 MG PO TABS
1000.0000 mg | ORAL_TABLET | Freq: Once | ORAL | Status: AC
  Administered 2011-12-15: 1000 mg via ORAL

## 2011-12-15 MED ORDER — DIPHENHYDRAMINE HCL 50 MG/ML IJ SOLN
50.0000 mg | Freq: Once | INTRAMUSCULAR | Status: AC
  Administered 2011-12-15: 50 mg via INTRAVENOUS

## 2011-12-15 MED ORDER — SODIUM CHLORIDE 0.9 % IV SOLN
Freq: Once | INTRAVENOUS | Status: AC
  Administered 2011-12-15: 250 mL via INTRAVENOUS

## 2012-02-09 ENCOUNTER — Encounter (HOSPITAL_COMMUNITY)
Admission: RE | Admit: 2012-02-09 | Discharge: 2012-02-09 | Disposition: A | Discharge status: Home / Self Care | Payer: 59 | Source: Ambulatory Visit | Attending: Gastroenterology | Admitting: Gastroenterology

## 2012-02-09 ENCOUNTER — Other Ambulatory Visit (HOSPITAL_COMMUNITY): Payer: Self-pay | Admitting: *Deleted

## 2012-02-09 DIAGNOSIS — K5090 Crohn's disease, unspecified, without complications: Secondary | ICD-10-CM | POA: Insufficient documentation

## 2012-02-09 MED ORDER — DIPHENHYDRAMINE HCL 50 MG/ML IJ SOLN
INTRAMUSCULAR | Status: AC
  Administered 2012-02-09: 40 mg via INTRAVENOUS
  Filled 2012-02-09: qty 1

## 2012-02-09 MED ORDER — SODIUM CHLORIDE 0.9 % IV SOLN
INTRAVENOUS | Status: DC
  Administered 2012-02-09: 250 mL via INTRAVENOUS

## 2012-02-09 MED ORDER — DIPHENHYDRAMINE HCL 50 MG/ML IJ SOLN
50.0000 mg | INTRAMUSCULAR | Status: DC
  Administered 2012-02-09: 40 mg via INTRAVENOUS

## 2012-02-09 MED ORDER — ACETAMINOPHEN 500 MG PO TABS
1000.0000 mg | ORAL_TABLET | ORAL | Status: DC
  Administered 2012-02-09: 1000 mg via ORAL

## 2012-02-09 MED ORDER — ACETAMINOPHEN 500 MG PO TABS
ORAL_TABLET | ORAL | Status: AC
  Administered 2012-02-09: 1000 mg via ORAL
  Filled 2012-02-09: qty 2

## 2012-02-09 MED ORDER — SODIUM CHLORIDE 0.9 % IV SOLN
800.0000 mg | INTRAVENOUS | Status: DC
  Administered 2012-02-09 (×3): 800 mg via INTRAVENOUS
  Filled 2012-02-09: qty 80

## 2012-04-11 ENCOUNTER — Other Ambulatory Visit (HOSPITAL_COMMUNITY): Payer: Self-pay | Admitting: *Deleted

## 2012-04-12 ENCOUNTER — Encounter (HOSPITAL_COMMUNITY)
Admission: RE | Admit: 2012-04-12 | Discharge: 2012-04-12 | Disposition: A | Discharge status: Home / Self Care | Payer: 59 | Source: Ambulatory Visit | Attending: Gastroenterology | Admitting: Gastroenterology

## 2012-04-12 DIAGNOSIS — K5090 Crohn's disease, unspecified, without complications: Secondary | ICD-10-CM | POA: Insufficient documentation

## 2012-04-12 MED ORDER — SODIUM CHLORIDE 0.9 % IV SOLN
800.0000 mg | INTRAVENOUS | Status: DC
  Administered 2012-04-12: 800 mg via INTRAVENOUS
  Filled 2012-04-12: qty 80

## 2012-04-12 MED ORDER — ACETAMINOPHEN 500 MG PO TABS
1000.0000 mg | ORAL_TABLET | ORAL | Status: AC
  Administered 2012-04-12: 1000 mg via ORAL

## 2012-04-12 MED ORDER — ACETAMINOPHEN 500 MG PO TABS
ORAL_TABLET | ORAL | Status: AC
  Filled 2012-04-12: qty 2

## 2012-04-12 MED ORDER — SODIUM CHLORIDE 0.9 % IV SOLN
INTRAVENOUS | Status: AC
  Administered 2012-04-12: 10:00:00 via INTRAVENOUS

## 2012-04-12 MED ORDER — DIPHENHYDRAMINE HCL 50 MG/ML IJ SOLN
50.0000 mg | INTRAMUSCULAR | Status: DC
  Administered 2012-04-12: 40 mg via INTRAVENOUS

## 2012-04-12 MED ORDER — DIPHENHYDRAMINE HCL 50 MG/ML IJ SOLN
INTRAMUSCULAR | Status: AC
  Filled 2012-04-12: qty 1

## 2012-06-07 ENCOUNTER — Encounter (HOSPITAL_COMMUNITY): Payer: 59

## 2012-06-17 ENCOUNTER — Other Ambulatory Visit (HOSPITAL_COMMUNITY): Payer: Self-pay | Admitting: *Deleted

## 2012-06-20 ENCOUNTER — Encounter (HOSPITAL_COMMUNITY): Payer: 59

## 2012-06-24 ENCOUNTER — Encounter (HOSPITAL_COMMUNITY)
Admission: RE | Admit: 2012-06-24 | Discharge: 2012-06-24 | Disposition: A | Discharge status: Home / Self Care | Payer: 59 | Source: Ambulatory Visit | Attending: Gastroenterology | Admitting: Gastroenterology

## 2012-06-24 DIAGNOSIS — K5090 Crohn's disease, unspecified, without complications: Secondary | ICD-10-CM | POA: Insufficient documentation

## 2012-06-24 MED ORDER — ACETAMINOPHEN 500 MG PO TABS
1000.0000 mg | ORAL_TABLET | Freq: Four times a day (QID) | ORAL | Status: DC | PRN
  Administered 2012-06-24: 1000 mg via ORAL

## 2012-06-24 MED ORDER — SODIUM CHLORIDE 0.9 % IV SOLN
800.0000 mg | INTRAVENOUS | Status: DC
  Administered 2012-06-24: 800 mg via INTRAVENOUS
  Filled 2012-06-24: qty 80

## 2012-06-24 MED ORDER — ACETAMINOPHEN 500 MG PO TABS: 500.0000 mg | ORAL_TABLET | Freq: Four times a day (QID) | ORAL | Status: DC | PRN

## 2012-06-24 MED ORDER — ACETAMINOPHEN 500 MG PO TABS
ORAL_TABLET | ORAL | Status: AC
  Administered 2012-06-24: 1000 mg via ORAL
  Filled 2012-06-24: qty 2

## 2012-06-24 MED ORDER — SODIUM CHLORIDE 0.9 % IV SOLN
INTRAVENOUS | Status: DC
  Administered 2012-06-24: 250 mL via INTRAVENOUS

## 2012-06-24 MED ORDER — DIPHENHYDRAMINE HCL 50 MG/ML IJ SOLN
50.0000 mg | Freq: Once | INTRAMUSCULAR | Status: AC
  Administered 2012-06-24: 50 mg via INTRAVENOUS

## 2012-06-24 MED ORDER — ACETAMINOPHEN 325 MG PO TABS: 650.0000 mg | ORAL_TABLET | Freq: Four times a day (QID) | ORAL | Status: DC | PRN

## 2012-06-24 MED ORDER — DIPHENHYDRAMINE HCL 50 MG/ML IJ SOLN
INTRAMUSCULAR | Status: AC
  Administered 2012-06-24: 50 mg via INTRAVENOUS
  Filled 2012-06-24: qty 1

## 2012-06-30 ENCOUNTER — Other Ambulatory Visit: Payer: Self-pay | Admitting: Dermatology

## 2012-08-18 ENCOUNTER — Other Ambulatory Visit (HOSPITAL_COMMUNITY): Payer: Self-pay | Admitting: *Deleted

## 2012-08-19 ENCOUNTER — Encounter (HOSPITAL_COMMUNITY)
Admission: RE | Admit: 2012-08-19 | Discharge: 2012-08-19 | Disposition: A | Discharge status: Home / Self Care | Payer: 59 | Source: Ambulatory Visit | Attending: Gastroenterology | Admitting: Gastroenterology

## 2012-08-19 DIAGNOSIS — K5090 Crohn's disease, unspecified, without complications: Secondary | ICD-10-CM | POA: Insufficient documentation

## 2012-08-19 MED ORDER — ACETAMINOPHEN 500 MG PO TABS
1000.0000 mg | ORAL_TABLET | ORAL | Status: DC
  Administered 2012-08-19: 1000 mg via ORAL

## 2012-08-19 MED ORDER — DIPHENHYDRAMINE HCL 50 MG/ML IJ SOLN: 50.0000 mg | INTRAMUSCULAR | Status: DC

## 2012-08-19 MED ORDER — ACETAMINOPHEN 500 MG PO TABS
ORAL_TABLET | ORAL | Status: AC
  Filled 2012-08-19: qty 2

## 2012-08-19 MED ORDER — SODIUM CHLORIDE 0.9 % IV SOLN
800.0000 mg | INTRAVENOUS | Status: DC
  Administered 2012-08-19: 800 mg via INTRAVENOUS
  Filled 2012-08-19: qty 80

## 2012-08-19 MED ORDER — SODIUM CHLORIDE 0.9 % IV SOLN
INTRAVENOUS | Status: DC
  Administered 2012-08-19: 11:00:00 via INTRAVENOUS

## 2012-08-19 MED ORDER — DIPHENHYDRAMINE HCL 50 MG/ML IJ SOLN
INTRAMUSCULAR | Status: AC
  Administered 2012-08-19: 50 mg via INTRAVENOUS
  Filled 2012-08-19: qty 1

## 2012-10-14 ENCOUNTER — Encounter (HOSPITAL_COMMUNITY): Payer: 59

## 2012-10-17 ENCOUNTER — Encounter (HOSPITAL_COMMUNITY)
Admission: RE | Admit: 2012-10-17 | Discharge: 2012-10-17 | Disposition: A | Discharge status: Home / Self Care | Payer: 59 | Source: Ambulatory Visit | Attending: Gastroenterology | Admitting: Gastroenterology

## 2012-10-17 DIAGNOSIS — K5090 Crohn's disease, unspecified, without complications: Secondary | ICD-10-CM | POA: Insufficient documentation

## 2012-10-17 MED ORDER — DIPHENHYDRAMINE HCL 50 MG/ML IJ SOLN: 50.0000 mg | INTRAMUSCULAR | Status: DC

## 2012-10-17 MED ORDER — ACETAMINOPHEN 500 MG PO TABS
ORAL_TABLET | ORAL | Status: AC
  Administered 2012-10-17: 1000 mg
  Filled 2012-10-17: qty 2

## 2012-10-17 MED ORDER — SODIUM CHLORIDE 0.9 % IV SOLN: INTRAVENOUS | Status: DC

## 2012-10-17 MED ORDER — SODIUM CHLORIDE 0.9 % IV SOLN
800.0000 mg | INTRAVENOUS | Status: AC
  Administered 2012-10-17: 800 mg via INTRAVENOUS
  Filled 2012-10-17: qty 80

## 2012-10-17 MED ORDER — ACETAMINOPHEN 500 MG PO TABS: 1000.0000 mg | ORAL_TABLET | ORAL | Status: DC

## 2012-10-17 MED ORDER — DIPHENHYDRAMINE HCL 50 MG/ML IJ SOLN
INTRAMUSCULAR | Status: AC
  Administered 2012-10-17: 50 mg
  Filled 2012-10-17: qty 1

## 2012-12-06 ENCOUNTER — Other Ambulatory Visit (HOSPITAL_COMMUNITY): Payer: Self-pay | Admitting: *Deleted

## 2012-12-12 ENCOUNTER — Encounter (HOSPITAL_COMMUNITY): Payer: 59

## 2012-12-21 ENCOUNTER — Encounter (HOSPITAL_COMMUNITY): Payer: 59

## 2012-12-27 ENCOUNTER — Encounter (HOSPITAL_COMMUNITY): Payer: 59

## 2012-12-30 ENCOUNTER — Other Ambulatory Visit (HOSPITAL_COMMUNITY): Payer: Self-pay | Admitting: *Deleted

## 2013-01-02 ENCOUNTER — Encounter (HOSPITAL_COMMUNITY)
Admission: RE | Admit: 2013-01-02 | Discharge: 2013-01-02 | Disposition: A | Discharge status: Home / Self Care | Payer: 59 | Source: Ambulatory Visit | Attending: Gastroenterology | Admitting: Gastroenterology

## 2013-01-02 DIAGNOSIS — K5090 Crohn's disease, unspecified, without complications: Secondary | ICD-10-CM | POA: Insufficient documentation

## 2013-01-02 MED ORDER — ACETAMINOPHEN 500 MG PO TABS
ORAL_TABLET | ORAL | Status: AC
  Filled 2013-01-02: qty 2

## 2013-01-02 MED ORDER — ACETAMINOPHEN 500 MG PO TABS
1000.0000 mg | ORAL_TABLET | ORAL | Status: DC
Start: 2013-01-02 — End: 2013-01-03
  Administered 2013-01-02: 1000 mg via ORAL

## 2013-01-02 MED ORDER — SODIUM CHLORIDE 0.9 % IV SOLN
INTRAVENOUS | Status: DC
  Administered 2013-01-02: 11:00:00 via INTRAVENOUS

## 2013-01-02 MED ORDER — DIPHENHYDRAMINE HCL 50 MG/ML IJ SOLN
INTRAMUSCULAR | Status: AC
  Filled 2013-01-02: qty 1

## 2013-01-02 MED ORDER — INFLIXIMAB 100 MG IV SOLR
800.0000 mg | INTRAVENOUS | Status: DC
  Administered 2013-01-02: 800 mg via INTRAVENOUS
  Filled 2013-01-02: qty 80

## 2013-01-02 MED ORDER — DIPHENHYDRAMINE HCL 50 MG/ML IJ SOLN
50.0000 mg | INTRAMUSCULAR | Status: DC
  Administered 2013-01-02: 50 mg via INTRAVENOUS

## 2013-02-06 ENCOUNTER — Inpatient Hospital Stay (HOSPITAL_COMMUNITY): Payer: 59

## 2013-02-06 ENCOUNTER — Encounter (HOSPITAL_COMMUNITY): Payer: Self-pay | Admitting: Emergency Medicine

## 2013-02-06 ENCOUNTER — Emergency Department (HOSPITAL_COMMUNITY): Payer: 59

## 2013-02-06 ENCOUNTER — Inpatient Hospital Stay (HOSPITAL_COMMUNITY)
Admission: EM | Admit: 2013-02-06 | Discharge: 2013-02-12 | DRG: 386 | Disposition: A | Discharge status: Home / Self Care | Payer: 59 | Attending: Internal Medicine | Admitting: Internal Medicine

## 2013-02-06 DIAGNOSIS — Z880 Allergy status to penicillin: Secondary | ICD-10-CM

## 2013-02-06 DIAGNOSIS — K5090 Crohn's disease, unspecified, without complications: Principal | ICD-10-CM | POA: Diagnosis present

## 2013-02-06 DIAGNOSIS — D72829 Elevated white blood cell count, unspecified: Secondary | ICD-10-CM

## 2013-02-06 DIAGNOSIS — Z886 Allergy status to analgesic agent status: Secondary | ICD-10-CM

## 2013-02-06 DIAGNOSIS — F411 Generalized anxiety disorder: Secondary | ICD-10-CM | POA: Diagnosis present

## 2013-02-06 DIAGNOSIS — R6510 Systemic inflammatory response syndrome (SIRS) of non-infectious origin without acute organ dysfunction: Secondary | ICD-10-CM | POA: Diagnosis present

## 2013-02-06 DIAGNOSIS — K219 Gastro-esophageal reflux disease without esophagitis: Secondary | ICD-10-CM | POA: Diagnosis present

## 2013-02-06 DIAGNOSIS — Z885 Allergy status to narcotic agent status: Secondary | ICD-10-CM

## 2013-02-06 DIAGNOSIS — N2889 Other specified disorders of kidney and ureter: Secondary | ICD-10-CM | POA: Diagnosis present

## 2013-02-06 DIAGNOSIS — R109 Unspecified abdominal pain: Secondary | ICD-10-CM | POA: Diagnosis present

## 2013-02-06 HISTORY — DX: Crohn's disease, unspecified, without complications: K50.90

## 2013-02-06 LAB — CBC WITH DIFFERENTIAL/PLATELET
Basophils Absolute: 0 10*3/uL (ref 0.0–0.1)
Basophils Relative: 0 % (ref 0–1)
Eosinophils Absolute: 0 10*3/uL (ref 0.0–0.7)
Eosinophils Relative: 0 % (ref 0–5)
HCT: 45.1 % (ref 39.0–52.0)
Hemoglobin: 16.1 g/dL (ref 13.0–17.0)
LYMPHS ABS: 1.4 10*3/uL (ref 0.7–4.0)
LYMPHS PCT: 9 % — AB (ref 12–46)
MCH: 32 pg (ref 26.0–34.0)
MCHC: 35.7 g/dL (ref 30.0–36.0)
MCV: 89.7 fL (ref 78.0–100.0)
Monocytes Absolute: 0.8 10*3/uL (ref 0.1–1.0)
Monocytes Relative: 5 % (ref 3–12)
NEUTROS ABS: 13 10*3/uL — AB (ref 1.7–7.7)
Neutrophils Relative %: 85 % — ABNORMAL HIGH (ref 43–77)
PLATELETS: 206 10*3/uL (ref 150–400)
RBC: 5.03 MIL/uL (ref 4.22–5.81)
RDW: 13.1 % (ref 11.5–15.5)
WBC: 15.3 10*3/uL — AB (ref 4.0–10.5)

## 2013-02-06 LAB — COMPREHENSIVE METABOLIC PANEL
ALK PHOS: 65 U/L (ref 39–117)
ALT: 28 U/L (ref 0–53)
AST: 20 U/L (ref 0–37)
Albumin: 3.8 g/dL (ref 3.5–5.2)
BILIRUBIN TOTAL: 0.7 mg/dL (ref 0.3–1.2)
BUN: 12 mg/dL (ref 6–23)
CALCIUM: 9.4 mg/dL (ref 8.4–10.5)
CHLORIDE: 101 meq/L (ref 96–112)
CO2: 20 meq/L (ref 19–32)
Creatinine, Ser: 1.19 mg/dL (ref 0.50–1.35)
GFR, EST AFRICAN AMERICAN: 78 mL/min — AB (ref >90)
GFR, EST NON AFRICAN AMERICAN: 68 mL/min — AB (ref >90)
GLUCOSE: 105 mg/dL — AB (ref 70–99)
Potassium: 3.8 mEq/L (ref 3.7–5.3)
SODIUM: 138 meq/L (ref 137–147)
Total Protein: 7.8 g/dL (ref 6.0–8.3)

## 2013-02-06 LAB — ANTITHROMBIN III: AntiThromb III Func: 97 % (ref 75–120)

## 2013-02-06 LAB — LACTATE DEHYDROGENASE: LDH: 365 U/L — ABNORMAL HIGH (ref 94–250)

## 2013-02-06 LAB — URINALYSIS, ROUTINE W REFLEX MICROSCOPIC
BILIRUBIN URINE: NEGATIVE
GLUCOSE, UA: NEGATIVE mg/dL
Hgb urine dipstick: NEGATIVE
KETONES UR: 40 mg/dL — AB
Leukocytes, UA: NEGATIVE
Nitrite: NEGATIVE
PROTEIN: NEGATIVE mg/dL
Specific Gravity, Urine: 1.018 (ref 1.005–1.030)
UROBILINOGEN UA: 0.2 mg/dL (ref 0.0–1.0)
pH: 7 (ref 5.0–8.0)

## 2013-02-06 LAB — CG4 I-STAT (LACTIC ACID): Lactic Acid, Venous: 2.46 mmol/L — ABNORMAL HIGH (ref 0.5–2.2)

## 2013-02-06 LAB — HOMOCYSTEINE: Homocysteine: 14 umol/L (ref 4.0–15.4)

## 2013-02-06 LAB — LIPASE, BLOOD: Lipase: 30 U/L (ref 11–59)

## 2013-02-06 MED ORDER — SODIUM CHLORIDE 0.9 % IJ SOLN
3.0000 mL | Freq: Two times a day (BID) | INTRAMUSCULAR | Status: DC
  Administered 2013-02-09 – 2013-02-10 (×2): 3 mL via INTRAVENOUS

## 2013-02-06 MED ORDER — SODIUM CHLORIDE 0.9 % IV BOLUS (SEPSIS)
1000.0000 mL | Freq: Once | INTRAVENOUS | Status: AC
  Administered 2013-02-06: 1000 mL via INTRAVENOUS

## 2013-02-06 MED ORDER — ONDANSETRON HCL 4 MG PO TABS: 4.0000 mg | ORAL_TABLET | Freq: Four times a day (QID) | ORAL | Status: DC | PRN

## 2013-02-06 MED ORDER — HYDROMORPHONE HCL PF 1 MG/ML IJ SOLN
1.0000 mg | Freq: Once | INTRAMUSCULAR | Status: AC
  Administered 2013-02-06: 1 mg via INTRAVENOUS
  Filled 2013-02-06: qty 1

## 2013-02-06 MED ORDER — HYDROMORPHONE HCL PF 1 MG/ML IJ SOLN
1.0000 mg | INTRAMUSCULAR | Status: DC | PRN
  Administered 2013-02-06 – 2013-02-12 (×12): 1 mg via INTRAVENOUS
  Filled 2013-02-06 (×12): qty 1

## 2013-02-06 MED ORDER — SODIUM CHLORIDE 0.9 % IV SOLN
INTRAVENOUS | Status: DC
  Administered 2013-02-07 – 2013-02-11 (×9): via INTRAVENOUS

## 2013-02-06 MED ORDER — SODIUM CHLORIDE 0.9 % IV SOLN
INTRAVENOUS | Status: AC
  Administered 2013-02-06: 1000 mL via INTRAVENOUS

## 2013-02-06 MED ORDER — ONDANSETRON HCL 4 MG/2ML IJ SOLN
4.0000 mg | Freq: Once | INTRAMUSCULAR | Status: AC
  Administered 2013-02-06: 4 mg via INTRAVENOUS
  Filled 2013-02-06: qty 2

## 2013-02-06 MED ORDER — ACETAMINOPHEN 650 MG RE SUPP: 650.0000 mg | Freq: Four times a day (QID) | RECTAL | Status: DC | PRN

## 2013-02-06 MED ORDER — METHYLPREDNISOLONE SODIUM SUCC 125 MG IJ SOLR
80.0000 mg | Freq: Four times a day (QID) | INTRAMUSCULAR | Status: DC
  Administered 2013-02-06 – 2013-02-10 (×16): 80 mg via INTRAVENOUS
  Filled 2013-02-06 (×6): qty 1.28
  Filled 2013-02-06: qty 2
  Filled 2013-02-06 (×6): qty 1.28
  Filled 2013-02-06: qty 2
  Filled 2013-02-06 (×2): qty 1.28
  Filled 2013-02-06: qty 2
  Filled 2013-02-06 (×2): qty 1.28
  Filled 2013-02-06 (×2): qty 2
  Filled 2013-02-06 (×2): qty 1.28
  Filled 2013-02-06: qty 2
  Filled 2013-02-06: qty 1.28

## 2013-02-06 MED ORDER — ACETAMINOPHEN 325 MG PO TABS: 650.0000 mg | ORAL_TABLET | Freq: Four times a day (QID) | ORAL | Status: DC | PRN

## 2013-02-06 MED ORDER — CLONAZEPAM 1 MG PO TABS
1.0000 mg | ORAL_TABLET | Freq: Every day | ORAL | Status: DC
  Administered 2013-02-07 – 2013-02-11 (×6): 1 mg via ORAL
  Filled 2013-02-06 (×7): qty 1

## 2013-02-06 MED ORDER — PANTOPRAZOLE SODIUM 40 MG PO TBEC
40.0000 mg | DELAYED_RELEASE_TABLET | Freq: Every day | ORAL | Status: DC
Start: 2013-02-06 — End: 2013-02-12
  Administered 2013-02-06 – 2013-02-12 (×7): 40 mg via ORAL
  Filled 2013-02-06 (×5): qty 1

## 2013-02-06 MED ORDER — ENOXAPARIN SODIUM 40 MG/0.4ML ~~LOC~~ SOLN
40.0000 mg | SUBCUTANEOUS | Status: DC
  Administered 2013-02-06 – 2013-02-11 (×6): 40 mg via SUBCUTANEOUS
  Filled 2013-02-06 (×7): qty 0.4

## 2013-02-06 MED ORDER — IOHEXOL 350 MG/ML SOLN: 100.0000 mL | Freq: Once | INTRAVENOUS | Status: AC | PRN

## 2013-02-06 MED ORDER — IOHEXOL 300 MG/ML  SOLN
80.0000 mL | Freq: Once | INTRAMUSCULAR | Status: AC | PRN
  Administered 2013-02-06: 80 mL via INTRAVENOUS

## 2013-02-06 MED ORDER — IOHEXOL 300 MG/ML  SOLN
25.0000 mL | INTRAMUSCULAR | Status: AC
  Administered 2013-02-06: 25 mL via ORAL

## 2013-02-06 MED ORDER — ONDANSETRON HCL 4 MG/2ML IJ SOLN
4.0000 mg | Freq: Four times a day (QID) | INTRAMUSCULAR | Status: DC | PRN
  Administered 2013-02-08 – 2013-02-12 (×10): 4 mg via INTRAVENOUS
  Filled 2013-02-06 (×10): qty 2

## 2013-02-06 MED ORDER — ALPRAZOLAM 0.5 MG PO TABS
0.5000 mg | ORAL_TABLET | Freq: Every evening | ORAL | Status: DC | PRN
  Administered 2013-02-08 – 2013-02-11 (×5): 0.5 mg via ORAL
  Filled 2013-02-06 (×6): qty 1

## 2013-02-06 NOTE — ED Provider Notes (Signed)
Is a new and CSN: [id number]     Arrival date & time 02/06/13  1101 History   First MD Initiated Contact with Patient 02/06/13 1116     Chief Complaint  Patient presents with  . Abdominal Pain   (Consider location/radiation/quality/duration/timing/severity/associated sxs/prior Treatment) Patient is a 53 y.o. male presenting with abdominal pain. The history is provided by the patient.  Abdominal Pain Associated symptoms: constipation, nausea and vomiting   Associated symptoms: no chest pain, no diarrhea and no shortness of breath    patient has had a history of Crohn's disease has had multiple previous surgeries. He states he had a narrowing a couple years ago that was treated without surgery. He states over the last month he has been having more problems on the right side. He states his been not able to eat the same thing. He states on 22-May-2022 became worse. He states his been vomiting since. He states he cannot keep anything down. He states he has not passed bowel movements and has passed minimal gas since May 22, 2022. No fevers.  Past Medical History  Diagnosis Date  . Crohn disease    Past Surgical History  Procedure Laterality Date  . Abdominal surgery     No family history on file. History  Substance Use Topics  . Smoking status: Never Smoker   . Smokeless tobacco: Not on file  . Alcohol Use: No    Review of Systems  Constitutional: Negative for activity change and appetite change.  Eyes: Negative for pain.  Respiratory: Negative for chest tightness and shortness of breath.   Cardiovascular: Negative for chest pain and leg swelling.  Gastrointestinal: Positive for nausea, vomiting, abdominal pain and constipation. Negative for diarrhea.  Genitourinary: Negative for flank pain.  Musculoskeletal: Negative for back pain and neck stiffness.  Skin: Negative for rash.  Neurological: Negative for weakness, numbness and headaches.  Psychiatric/Behavioral: Negative for behavioral  problems.    Allergies  Penicillins; Ibuprofen; Morphine and related; Nsaids; Other; and Tetracyclines & related  Home Medications  No current outpatient prescriptions on file. BP 114/60  Pulse 70  Temp(Src) 98.6 F (37 C) (Oral)  Resp 18  Ht 5\' 10"  (1.778 m)  Wt 212 lb 11.2 oz (96.48 kg)  BMI 30.52 kg/m2  SpO2 99% Physical Exam  Nursing note and vitals reviewed. Constitutional: He is oriented to person, place, and time. He appears well-developed and well-nourished.  Patient appears uncomfortable  HENT:  Head: Normocephalic and atraumatic.  Eyes: EOM are normal. Pupils are equal, round, and reactive to light.  Neck: Normal range of motion. Neck supple.  Cardiovascular: Normal rate, regular rhythm and normal heart sounds.   No murmur heard. Pulmonary/Chest: Effort normal and breath sounds normal.  Abdominal: Soft. Bowel sounds are normal. He exhibits distension. He exhibits no mass. There is tenderness. There is guarding. There is no rebound.  Moderate diffuse tenderness, worse on right lower quadrant. Scars from previous surgeries. Mild distention.  Musculoskeletal: Normal range of motion. He exhibits no edema.  Neurological: He is alert and oriented to person, place, and time. No cranial nerve deficit.  Skin: Skin is warm and dry.  Psychiatric: He has a normal mood and affect.    ED Course  Procedures (including critical care time) Labs Review Labs Reviewed  CBC WITH DIFFERENTIAL - Abnormal; Notable for the following:    WBC 15.3 (*)    Neutrophils Relative % 85 (*)    Neutro Abs 13.0 (*)    Lymphocytes Relative 9 (*)  All other components within normal limits  COMPREHENSIVE METABOLIC PANEL - Abnormal; Notable for the following:    Glucose, Bld 105 (*)    GFR calc non Af Amer 68 (*)    GFR calc Af Amer 78 (*)    All other components within normal limits  URINALYSIS, ROUTINE W REFLEX MICROSCOPIC - Abnormal; Notable for the following:    Ketones, ur 40 (*)    All  other components within normal limits  LACTATE DEHYDROGENASE - Abnormal; Notable for the following:    LDH 365 (*)    All other components within normal limits  PROTEIN C ACTIVITY - Abnormal; Notable for the following:    Protein C Activity 142 (*)    All other components within normal limits  LUPUS ANTICOAGULANT PANEL - Abnormal; Notable for the following:    Drvvt 43.2 (*)    All other components within normal limits  CARDIOLIPIN ANTIBODIES, IGG, IGM, IGA - Abnormal; Notable for the following:    Anticardiolipin IgG 9 (*)    Anticardiolipin IgM 2 (*)    Anticardiolipin IgA 10 (*)    All other components within normal limits  BASIC METABOLIC PANEL - Abnormal; Notable for the following:    Sodium 136 (*)    Glucose, Bld 132 (*)    GFR calc non Af Amer 76 (*)    GFR calc Af Amer 88 (*)    All other components within normal limits  CG4 I-STAT (LACTIC ACID) - Abnormal; Notable for the following:    Lactic Acid, Venous 2.46 (*)    All other components within normal limits  LIPASE, BLOOD  ANTITHROMBIN III  PROTEIN S ACTIVITY  BETA-2-GLYCOPROTEIN I ABS, IGG/M/A  HOMOCYSTEINE  LACTIC ACID, PLASMA  PROCALCITONIN  PROTEIN C, TOTAL  PROTEIN S, TOTAL  FACTOR 5 LEIDEN  PROTHROMBIN GENE MUTATION  CBC  COMPREHENSIVE METABOLIC PANEL   Imaging Review Ct Abdomen Pelvis W Contrast  02/06/2013   CLINICAL DATA:  Crohn's disease, nausea, vomiting, right lower quadrant pain. No diarrhea.  EXAM: CT ABDOMEN AND PELVIS WITH CONTRAST  TECHNIQUE: Multidetector CT imaging of the abdomen and pelvis was performed using the standard protocol following bolus administration of intravenous contrast.  CONTRAST:  88mL OMNIPAQUE IOHEXOL 300 MG/ML  SOLN  COMPARISON:  DG ABD ACUTE W/CHEST dated 02/06/2013; CT ABD/PELVIS W CM dated 02/28/2010  FINDINGS: Lung bases are clear.  No pericardial fluid.  There are several small hypodensities in the liver unchanged from prior likely representing benign hepatic cysts.  Gallbladder , pancreas, spleen, and adrenal glands are normal.  There is a low-density cortical lesion within the mid right kidney as well as less well-defined cortical lesions in the upper pole of the right kidney. No significant perinephric stranding. No evidence of obstruction. The is finding is new from comparison exam.  Stomach, small bowel, and cecum are normal. There is no evidence of inflammation at the terminal ileum or distal ileum. No fistula or abscess. Beginning in the transverse colon there is a collapse of the colon throughout with mild bowel wall edema. This may be extent a exaggerated to the collapse of the bowel. Single diverticulum along the descending colon.  Abdominal or is normal caliber. No retroperitoneal periportal lymphadenopathy. Base the aorta appear normal.  No free fluid the pelvis. The prostate gland and bladder normal. No pelvic lymphadenopathy. Insert negative then  IMPRESSION: 1. New cortical hypodensities in the mid and upper right kidney with differential including acute pyelonephritis versus renal infarctions. 2. No  evidence of active Crohn's disease in the small bowel or cecum. 3. Mild bowel wall thickening from the hepatic flexure through to the rectum. This likely relates to chronic inflammation from inflammatory bowel disease. This is similar to comparison exam.   Electronically Signed   By: Genevive Bi M.D.   On: 02/06/2013 14:16   Dg Abd Acute W/chest  02/06/2013   CLINICAL DATA:  Right lower abdominal pain  EXAM: ACUTE ABDOMEN SERIES (ABDOMEN 2 VIEW & CHEST 1 VIEW)  COMPARISON:  CT abdomen pelvis dated 02/18/2010  FINDINGS: Lungs are clear. No pleural effusion or pneumothorax.  The heart is normal in size.  Nonspecific bowel gas pattern, but without disproportionate small bowel dilatation to suggest small bowel obstruction. Mildly prominent but nondilated loops of small bowel in the left mid abdomen are nonspecific, possibly reflecting adynamic ileus or small bowel  enteritis.  No evidence of free air under the diaphragm on the upright view.  Mild degenerative changes of the visualized thoracolumbar spine.  IMPRESSION: No evidence of acute cardiopulmonary disease.  No evidence of small bowel obstruction or free air.  Possible adynamic small bowel ileus versus small bowel enteritis.   Electronically Signed   By: Charline Bills M.D.   On: 02/06/2013 12:26   Ct Angio Abd/pel W/ And/or W/o  02/07/2013   CLINICAL DATA:  Evaluate for right renal artery stenosis or renal infarct, history of Crohn's disease  EXAM: CT ANGIOGRAPHY ABDOMEN WITH CONTRAST  TECHNIQUE: Multidetector CT imaging of the abdomen was performed using the standard protocol during bolus administration of intravenous contrast. Multiplanar reconstructed images including MIPs were obtained and reviewed to evaluate the vascular anatomy.  CONTRAST:  OMNIPAQUE IOHEXOL 350 MG/ML SOLN  COMPARISON:  CT ABD/PELVIS W CM dated 02/06/2013; CT ABD/PELVIS W CM dated 02/28/2010  FINDINGS: Vascular Findings:  Abdominal aorta: Normal caliber of the abdominal aorta. No abdominal aortic dissection or periaortic stranding.  Celiac artery: Widely patent, conventional branching pattern.  SMA: Widely patent, the conventional branching pattern.  Right Renal artery: Solitary; widely patent without hemodynamically significant narrowing. There is no evidence of dissection, vessel irregular or perivascular stranding.  Left Renal artery: Solitary; widely patent without hemodynamically significant narrowing. No evidence of dissection, vessel irregularity or perivascular stranding.  IMA: Widely patent throughout its imaged course.  Venous:  The IVC and bilateral renal veins appear widely patent.  Review of the MIP images confirms the above findings.   --------------------------------------------------------------------------------  Nonvascular Findings:  Normal hepatic contour. There are multiple hypo attenuating lesions scattered within  the liver with dominant approximately 1 cm hypo attenuating (7 Hounsfield unit) lesion compatible with a hepatic cyst (image 7, series 10). Additional subcentimeter hypoattenuating renal lesions are too small to accurately characterize of favored to represent additional hepatic cysts. There is high-density material lying dependently within the gallbladder which is favored to represent vicarious excretion of contrast following recently performed contrast enhanced abdominal CT. No intra or extra hepatic biliary duct dilatation. No ascites.  There is symmetric enhancement and excretion of the bilateral kidneys. Ill-defined wedge-shaped areas of relative hypoattenuation with primarily involving the mid and superior pole of the right kidney appears grossly unchanged (best seen on coronal image 94, series 13). This finding is again without associated perinephric stranding. Subcentimeter (approximately 6 mm) hypoattenuating lesion within in the superior though favored to represent a renal cyst. No definite renal stones on this postcontrast examination. No urinary obstruction.  Normal appearance of the bilateral kidneys, pancreas and spleen.  Visualized loops  of bowel appear normal. No pneumoperitoneum, pneumatosis or portal venous gas. No retroperitoneal or mesenteric lymphadenopathy.  Limited visualization of the lower thorax is negative for focal airspace opacity or pleural effusion. Normal heart size. No pericardial effusion.  No acute or aggressive osseus abnormalities. There is a small focus of subcutaneous emphysema within the subcutaneous tissues of the right lateral lower abdomen (image 125, series 4), presumably at the location of subcutaneous medication administration.  IMPRESSION: Grossly unchanged wedge-shaped defects involving the mid aspect and superior pole of the right kidney without associated perinephric stranding. There is no evidence of right-sided renal artery stenosis, dissection or vessel  irregularity to suggest FMD. The etiology of this apparent wedge shaped defect is indeterminate, and while possibly infectious, further evaluation with cardiac echo could be performed to evaluate for a cardiac source of emboli.   Electronically Signed   By: Simonne ComeJohn  Watts M.D.   On: 02/07/2013 17:18    EKG Interpretation    Date/Time:    Ventricular Rate:    PR Interval:    QRS Duration:   QT Interval:    QTC Calculation:   R Axis:     Text Interpretation:              MDM   1. Abdominal pain   2. Crohn disease   3. SIRS (systemic inflammatory response syndrome)    Patient with abdominal pain and Crohn's disease. Continued tenderness. Lab work reassuring CT scan showed no obstruction, but had findings in the right kidney. Infarct versus infection, urine does not show UTI. Was admitted to internal medicine    Juliet RudeNathan R. Rubin PayorPickering, MD 02/07/13 [phone]

## 2013-02-06 NOTE — H&P (Addendum)
[facility] History and Physical  OSA CAMPOLI ZOX:[id number] DOB: 18-Jul-1960 DOA: 02/06/2013  Referring physician :Dr Rubin Payor PCP: [facility]  Chief Complaint: Abdominal pain and bloating for 5 days   HPI:  52 year ordered a meal with history of Crohn's disease with fistula in ano, follows with Dr Randa Evens with [facility] and on IV remicade every other month (last received about 6 weeks back) presented to the ED lead right mid quadrant abdominal pain, fullness and nausea poor by mouth intake. Patient reports going to a [place] with her family 5 days back when he had some salsa chips and a drink following which he had several episodes of vomiting and started having abdominal discomfort. The pain is primarily located over right mid quadrant and is dull aching and nonradiating. He denies any aggravating or relieving factors. He feels that his symptoms have been similar to his bowel obstruction in the past. Patient reports having difficulty to hold food down and not having any bowel movement for almost 3 days. He reports having a small bowel movement yesterday. He denies any subjective fever or chills. Denies any headache, blurred vision, dizziness, chest pain, palpitations, shortness of breath, blood in stool, dysuria or hematuria. He does report generalized fatigue. Denies any flank pain but does report having some low back pain 2 weeks back. He reports taking 1-2 tablets of prednisone thinking this to be his Crohn's flare. He denies any change in his medications. She denies any history of blood clots or irreregular heart rhythm.   Course in the ED Patient was found to be mildly tachycardic 203, respiratory rate of 30, afebrile and normal blood pressure. O2 sat was normal on room air. Blood work done showed leukocytosis with WBC of 15.3 K. , normal chemistry and LFTs. Fatty acid was elevated to 2.6. An abdominal x-ray done showed possible bowel enteritis. This was followed  by a CT scan of the abdomen with contrast which showed a new cortical hypodensities in the mid and upper right kidney with possibility of acute pyelonephritis versus renal infarctions. No active Crohn's disease noted. Mild bowel wall thickening of the hepatic flexure through to the rectum was noted which was reported to be chronic. [facility] consulted for admission to medical floor.   Review of Systems:  Constitutional: Denies fever, chills, diaphoresis, appetite change and fatigue.  HEENT: Denies photophobia, eye pain, congestion, sore throat, rhinorrhea, sneezing, mouth sores, trouble swallowing, neck pain, neck stiffness and tinnitus.   Respiratory: Denies SOB, DOE, cough, chest tightness,  and wheezing.   Cardiovascular: Denies chest pain, palpitations and leg swelling.  Gastrointestinal: Denies nausea, vomiting, abdominal pain, abdominal distention, constipation,  denies diarrhea or blood in stool Genitourinary: urgency, frequency, (related to enlarged prostate) Denies dysuria, hematuria, flank pain and difficulty urinating.  Endocrine: Denies  polyuria, polydipsia. Musculoskeletal: Denies myalgias, back pain, joint swelling, arthralgias and gait problem.  Skin: Denies pallor, rash and wound.  Neurological: Denies dizziness, seizures, syncope, weakness, light-headedness, numbness and headaches.     Past Medical History  Diagnosis Date  . Crohn disease    Past Surgical History  Procedure Laterality Date  . Abdominal surgery     Social History:  reports that he has never smoked. He does not have any smokeless tobacco history on file. He reports that he does not drink alcohol or use illicit drugs.  Allergies  Allergen Reactions  . Penicillins Other (See Comments)    Digestive tract infections  . Ibuprofen Other (See Comments)  Interacts with chrohns and causes blood loss  . Morphine And Related Hives  . Nsaids Other (See Comments)    Interacts with chrohns and causes  blood loss  . Other Other (See Comments)    Antibiotics:due to chrohns can not take by mouth but can take IV  . Tetracyclines & Related Other (See Comments)    Lost skin when exposed to the sun    No family history on file.  Prior to Admission medications   Medication Sig Start Date End Date Taking? Authorizing Provider  ALPRAZolam Prudy Feeler) 0.5 MG tablet Take 0.5 mg by mouth at bedtime as needed for anxiety.   Yes Historical Provider, MD  CALCIUM & MAGNESIUM CARBONATES PO Take 1 tablet by mouth daily as needed.   Yes Historical Provider, MD  clonazePAM (KLONOPIN) 1 MG tablet Take 1 mg by mouth at bedtime.   Yes Historical Provider, MD  omeprazole (PRILOSEC) 20 MG capsule Take 20 mg by mouth daily as needed (heart burn).   Yes Historical Provider, MD  OVER THE COUNTER MEDICATION Place 1-2 drops into both eyes daily as needed (dry eyes). Eye drops   Yes Historical Provider, MD  Potassium (POTASSIMIN PO) Take 1 tablet by mouth every other day.   Yes Historical Provider, MD  prednisoLONE 5 MG TABS tablet Take 15-20 mg by mouth daily as needed (flare ups).   Yes Historical Provider, MD    Physical Exam:  Filed Vitals:   02/06/13 1615 02/06/13 1630 02/06/13 1645 02/06/13 1700  BP: 124/79 125/68 122/102 112/53  Pulse: 78 86 103 70  Temp:      TempSrc:      Resp:      Height:      Weight:      SpO2: 94% 93% 92% 95%    Constitutional: Vital signs reviewed.  Patient is a middle aged male lying in bed in no acute distress. Has a flushed face HEENT: No pallor, vital mucosa, no icterus, no cervical lymphadenopathy  Cardiovascular: RRR, S1 normal, S2 normal, no MRG,  Pulmonary/Chest: CTAB, no wheezes, rales, or rhonchi Abdominal: Soft.  bowel sounds are normal, mildly distended, tender to deep palpation over right mid quadrant. no masses, organomegaly, or guarding present.  GU: no CVA tenderness bilaterally   extremities: Warm, no edema CNS: AAO x3  Labs on Admission:  Basic Metabolic  Panel:  Recent Labs Lab 02/06/13 1156  NA 138  K 3.8  CL 101  CO2 20  GLUCOSE 105*  BUN 12  CREATININE 1.19  CALCIUM 9.4   Liver Function Tests:  Recent Labs Lab 02/06/13 1156  AST 20  ALT 28  ALKPHOS 65  BILITOT 0.7  PROT 7.8  ALBUMIN 3.8    Recent Labs Lab 02/06/13 1156  LIPASE 30   No results found for this basename: AMMONIA,  in the last 168 hours CBC:  Recent Labs Lab 02/06/13 1156  WBC 15.3*  NEUTROABS 13.0*  HGB 16.1  HCT 45.1  MCV 89.7  PLT 206   Cardiac Enzymes: No results found for this basename: CKTOTAL, CKMB, CKMBINDEX, TROPONINI,  in the last 168 hours BNP: No components found with this basename: POCBNP,  CBG: No results found for this basename: GLUCAP,  in the last 168 hours  Radiological Exams on Admission: Ct Abdomen Pelvis W Contrast  02/06/2013   CLINICAL DATA:  Crohn's disease, nausea, vomiting, right lower quadrant pain. No diarrhea.  EXAM: CT ABDOMEN AND PELVIS WITH CONTRAST  TECHNIQUE: Multidetector CT imaging of  the abdomen and pelvis was performed using the standard protocol following bolus administration of intravenous contrast.  CONTRAST:  80mL OMNIPAQUE IOHEXOL 300 MG/ML  SOLN  COMPARISON:  DG ABD ACUTE W/CHEST dated 02/06/2013; CT ABD/PELVIS W CM dated 02/28/2010  FINDINGS: Lung bases are clear.  No pericardial fluid.  There are several small hypodensities in the liver unchanged from prior likely representing benign hepatic cysts. Gallbladder , pancreas, spleen, and adrenal glands are normal.  There is a low-density cortical lesion within the mid right kidney as well as less well-defined cortical lesions in the upper pole of the right kidney. No significant perinephric stranding. No evidence of obstruction. The is finding is new from comparison exam.  Stomach, small bowel, and cecum are normal. There is no evidence of inflammation at the terminal ileum or distal ileum. No fistula or abscess. Beginning in the transverse colon there is a  collapse of the colon throughout with mild bowel wall edema. This may be extent a exaggerated to the collapse of the bowel. Single diverticulum along the descending colon.  Abdominal or is normal caliber. No retroperitoneal periportal lymphadenopathy. Base the aorta appear normal.  No free fluid the pelvis. The prostate gland and bladder normal. No pelvic lymphadenopathy. Insert negative then  IMPRESSION: 1. New cortical hypodensities in the mid and upper right kidney with differential including acute pyelonephritis versus renal infarctions. 2. No evidence of active Crohn's disease in the small bowel or cecum. 3. Mild bowel wall thickening from the hepatic flexure through to the rectum. This likely relates to chronic inflammation from inflammatory bowel disease. This is similar to comparison exam.   Electronically Signed   By: Genevive BiStewart  Edmunds M.D.   On: 02/06/2013 14:16   Dg Abd Acute W/chest  02/06/2013   CLINICAL DATA:  Right lower abdominal pain  EXAM: ACUTE ABDOMEN SERIES (ABDOMEN 2 VIEW & CHEST 1 VIEW)  COMPARISON:  CT abdomen pelvis dated 02/18/2010  FINDINGS: Lungs are clear. No pleural effusion or pneumothorax.  The heart is normal in size.  Nonspecific bowel gas pattern, but without disproportionate small bowel dilatation to suggest small bowel obstruction. Mildly prominent but nondilated loops of small bowel in the left mid abdomen are nonspecific, possibly reflecting adynamic ileus or small bowel enteritis.  No evidence of free air under the diaphragm on the upright view.  Mild degenerative changes of the visualized thoracolumbar spine.  IMPRESSION: No evidence of acute cardiopulmonary disease.  No evidence of small bowel obstruction or free air.  Possible adynamic small bowel ileus versus small bowel enteritis.   Electronically Signed   By: Charline BillsSriyesh  Krishnan M.D.   On: 02/06/2013 12:26    EKG: Pending  Assessment/Plan Principal Problem:   SIRS (systemic inflammatory response syndrome) -Admit   to telemetry Patient meets criteria for SIRS with mid tachycardia and tachypnea and elevated lactic acid. Symptoms abdominal pain possibly related to Crohn's flareup. CT scan of the abdomen comments on new cortical hypodensities over the mid and upper right kidney which could be related to acute pyelonephritis versus renal infarct. Patient urine analysis is normal. He does not have any CVA tenderness. He does not have any associated fevers. He has no history of A. fib or hypercoagulable state. -I will start him on IV hydration and supportive care including when necessary IV Dilaudid and antiemetics. Monitor lactic acid level in the morning. Serial abdominal exam. [facility] has been consulted (Dr. Bosie Closschooler) who will see patient in the morning. I have started him on IV Solu-Medrol  80 mg every 6 hours. i will place him on empiric ciprofloxacin and flagyl.  ? Acute pyelonephritis versus right renal infarct Patient does not have any urinating symptoms or findings on UA. Does not have any CVA tenderness associated fever. No history of A. fib or blood clots. Renal function is normal. I will check an EKG and 2-D echo. Check LDH. Hypercoagulable workup ordered. I discussed with the radiologist Dr. Amil Amen who recommended a CT angiogram of the abdomen could be done to see for renal artery stenosis. Patient is on empiric ciprofloxacin which should cover for possible pyelonephritis.   GERD Continue PPI  Anxiety symptoms Resume home medications  DVT prophylaxis: Subcutaneous Lovenox Diet: Clear liquids      Code Status: Full code Family Communication: Wife and mother at bedside Disposition Plan: Home once improved  Eddie [place] [facility] Pager [phone]  If 7PM-7AM, please contact night-coverage [email or web address] Password [facility] 02/06/2013, 5:12 PM    Total time spent on admission: 70 minutes

## 2013-02-06 NOTE — ED Notes (Signed)
NOTIFIED DR, Rubin Payor OF PATIENTS LAB RESULTS OF CG4 ,LACTIC ACID ,02/06/2013.

## 2013-02-06 NOTE — Progress Notes (Signed)
Pt arrived from the ED via stretcher. Pt alert and orient. No distress or discomfort observed. Pt able to ambulate from stretcher to the hospital bed without assistance. Pt did comment that walking caused some discomfort but he denied needing any pain medication. VSS. Family at bedside. Pt made comfortable in the bed/room and given call light. Side rails up x2, bed low and locked. Admission orders reviewed. Patient place on telemetry monitor. Will continue to monitor. -Alwyn RenJamie Carizma Dunsworth, RN

## 2013-02-06 NOTE — ED Notes (Signed)
Pt reports hx of chron's. Nausea/vomiting/RLQ abdominal pain since Thursday. No diarrhea.

## 2013-02-07 ENCOUNTER — Encounter (HOSPITAL_COMMUNITY): Payer: Self-pay | Admitting: *Deleted

## 2013-02-07 ENCOUNTER — Inpatient Hospital Stay (HOSPITAL_COMMUNITY): Payer: 59

## 2013-02-07 DIAGNOSIS — I519 Heart disease, unspecified: Secondary | ICD-10-CM

## 2013-02-07 LAB — CARDIOLIPIN ANTIBODIES, IGG, IGM, IGA
ANTICARDIOLIPIN IGA: 10 U/mL — AB (ref <22)
Anticardiolipin IgG: 9 GPL U/mL — ABNORMAL LOW (ref <23)
Anticardiolipin IgM: 2 MPL U/mL — ABNORMAL LOW (ref <11)

## 2013-02-07 LAB — LACTIC ACID, PLASMA: Lactic Acid, Venous: 1 mmol/L (ref 0.5–2.2)

## 2013-02-07 LAB — BASIC METABOLIC PANEL
BUN: 11 mg/dL (ref 6–23)
CO2: 22 meq/L (ref 19–32)
Calcium: 8.9 mg/dL (ref 8.4–10.5)
Chloride: 101 mEq/L (ref 96–112)
Creatinine, Ser: 1.08 mg/dL (ref 0.50–1.35)
GFR calc Af Amer: 88 mL/min — ABNORMAL LOW (ref >90)
GFR calc non Af Amer: 76 mL/min — ABNORMAL LOW (ref >90)
GLUCOSE: 132 mg/dL — AB (ref 70–99)
POTASSIUM: 4.6 meq/L (ref 3.7–5.3)
Sodium: 136 mEq/L — ABNORMAL LOW (ref 137–147)

## 2013-02-07 LAB — PROTEIN C ACTIVITY: PROTEIN C ACTIVITY: 142 % — AB (ref 75–133)

## 2013-02-07 LAB — LUPUS ANTICOAGULANT PANEL
DRVVT: 43.2 secs — ABNORMAL HIGH (ref <42.9)
LUPUS ANTICOAGULANT: NOT DETECTED
PTT Lupus Anticoagulant: 30.8 secs (ref 28.0–43.0)
dRVVT Incubated 1:1 Mix: 37.3 secs (ref <42.9)

## 2013-02-07 LAB — PROCALCITONIN: Procalcitonin: <0.1 ng/mL

## 2013-02-07 LAB — BETA-2-GLYCOPROTEIN I ABS, IGG/M/A
BETA 2 GLYCO I IGG: 4 G Units (ref <20)
Beta-2-Glycoprotein I IgA: 7 A Units (ref <20)
Beta-2-Glycoprotein I IgM: 5 M Units (ref <20)

## 2013-02-07 LAB — PROTEIN S ACTIVITY: Protein S Activity: 92 % (ref 69–129)

## 2013-02-07 MED ORDER — PIPERACILLIN-TAZOBACTAM 3.375 G IVPB
3.3750 g | Freq: Three times a day (TID) | INTRAVENOUS | Status: DC
  Administered 2013-02-07 – 2013-02-12 (×15): 3.375 g via INTRAVENOUS
  Filled 2013-02-07 (×17): qty 50

## 2013-02-07 MED ORDER — PIPERACILLIN-TAZOBACTAM 3.375 G IVPB 30 MIN: 3.3750 g | Freq: Three times a day (TID) | INTRAVENOUS | Status: DC

## 2013-02-07 MED ORDER — IOHEXOL 350 MG/ML SOLN
100.0000 mL | Freq: Once | INTRAVENOUS | Status: AC | PRN
  Administered 2013-02-07: 100 mL via INTRAVENOUS

## 2013-02-07 NOTE — Progress Notes (Signed)
  Echocardiogram 2D Echocardiogram has been performed.  Cathie Beams 02/07/2013, 3:22 PM

## 2013-02-07 NOTE — Progress Notes (Signed)
Note: This document was prepared with digital dictation and possible smart phrase technology. Any transcriptional errors that result from this process are unintentional.   Isaac Mckenzie:[id number] DOB: 25-Sep-1960 DOA: 02/06/2013 PCP: Pcp Not In System  Brief narrative: 53 y/o ?, known Crohn's disease . 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 He states that after his last infusion of Remicade in December, he actually felt worse rather than better and had been eating less and less because of this discomfort which came to a head suddenly the day of admission Denies dysuria, denies fever, denies chills  Past medical history-As per Problem list Chart reviewed as below- Reviewed  Consultants:  Gastroenterology  Procedures:  CT abdomen pelvis  Antibiotics:  Cipro 1/26 in ed  Zosyn 02/07/13   Subjective  Feels fair. Pain controlled.  Tol sips  About to go for Ct scan abd [had it at about 1:30 yesterday morning] No stool as yet   Objective    Interim History: None  Telemetry: None   Objective: Filed Vitals:   02/06/13 1730 02/06/13 1836 02/06/13 2138 02/07/13 0507  BP:  142/80 120/63 92/58  Pulse: 74 77 72 70  Temp:  98.5 F (36.9 C) 98.9 F (37.2 C) 97.9 F (36.6 C)  TempSrc:  Oral Oral Oral  Resp: 12 16 16 17   Height:  5\' 10"  (1.778 m)    Weight:  96.48 kg (212 lb 11.2 oz)    SpO2: 95% 96% 96% 96%    Intake/Output Summary (Last 24 hours) at 02/07/13 1237 Last data filed at 02/07/13 0900  Gross per 24 hour  Intake   1500 ml  Output    480 ml  Net   1020 ml    Exam:  General: EOMI, NCAT, anxious Cardiovascular: S1-S2 no murmur or gallop Respiratory: Clinically clear Abdomen: Slightly tender right lower quadrant no rebound no guarding no distention Skin grossly intact Neuro  intact  Data Reviewed: Basic Metabolic Panel:  Recent Labs Lab 02/06/13 1156 02/07/13 0525  NA 138 136*  K 3.8 4.6  CL 101 101  CO2 20 22  GLUCOSE 105* 132*  BUN 12 11  CREATININE 1.19 1.08  CALCIUM 9.4 8.9   Liver Function Tests:  Recent Labs Lab 02/06/13 1156  AST 20  ALT 28  ALKPHOS 65  BILITOT 0.7  PROT 7.8  ALBUMIN 3.8    Recent Labs Lab 02/06/13 1156  LIPASE 30   No results found for this basename: AMMONIA,  in the last 168 hours CBC:  Recent Labs Lab 02/06/13 1156  WBC 15.3*  NEUTROABS 13.0*  HGB 16.1  HCT 45.1  MCV 89.7  PLT 206   Cardiac Enzymes: No results found for this basename: CKTOTAL, CKMB, CKMBINDEX, TROPONINI,  in the last 168 hours BNP: No components found with this basename: POCBNP,  CBG: No results found for this basename: GLUCAP,  in the last 168 hours  No results found for this or any previous visit (from the past 240 hour(s)).   Studies:              All Imaging reviewed and is as per above notation   Scheduled Meds: . clonazePAM  1 mg Oral QHS  . enoxaparin (LOVENOX) injection  40 mg Subcutaneous Q24H  . methylPREDNISolone (SOLU-MEDROL) injection  80 mg Intravenous Q6H  . pantoprazole  40 mg Oral Daily  . sodium chloride  3 mL Intravenous Q12H   Continuous Infusions: . sodium chloride       Assessment/Plan:  1.  Crohn's flare-unclear if patient is now currently considered Remicade failure?-antibodies to Remicade can develop and decrease clinical efficacy and patient may benefit from trial alternates to Anti-TNF therapy i.e prior. Thiopurine derviatives such as azathioprine or 6-mercaptopurine.  Defer decision-making to gastroenterology. For now continue Solu-Medrol 80 mg q. 6 hourly 2. Sepsis- rule out Renal infarct vs. infection-will presume for now that this is infectious given that admission lactic acidosis 2.6, WBC 15.3,.-Lactic acid has dropped to 1.0. Will get pro calcitonin today to rule out infectious cause or  need to escalate at about Continue Zosyn IV 3.375 every 8 hourly-received ciprofloxacin in the emergency room.  3. ? Renal infarct-agree with continuing workup-await echocardiogram.   I am not sure how to interpret LDH of 365 as it is a nonspecific indicator [mainly for hemolysis which this patient does not have, it is a nonspecific indicator of cancer]-homocystine is 14 which is within the realm of NOrmalcy. DRVVT is also nonspecific and elevated and could be secondary to Remicade  Code Status: Full Family Communication: Long discussion with patient and wife at bedside today Disposition Plan: Inpatient pending CT angiogram   Pleas KochJai Wei Poplaski, MD  [facility] Pager [phone] 02/07/2013, 12:37 PM    LOS: 1 day

## 2013-02-07 NOTE — Consult Note (Signed)
Subjective:   HPI  The patient is a 53 year old male with a history of long-standing Crohn's disease. He has been on Remicade infusions for about 8 years. He also has a history of having had several abdominal surgeries with resection of a part of his colon as well as small bowel in the past. 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 His last colonoscopy was in 2012 which did not show evidence of any active Crohn's disease. He has had bowel obstructions in the past. He states that after his last dose of Remicade on December 16 his abdomen has not been feeling very well. He was thinking that he might have some inflammation going on. A few days ago he started noticing abdominal distention and had some vomiting and thought that this was because he was having a bowel obstruction again which she has gone through in the past. He started some prednisone at home and rested his gut but continued to have problems. He came to the emergency room where he was admitted. A CT scan of the abdomen did not show any evidence of active Crohn's disease or bowel obstruction at this time. He has been started on IV Solu-Medrol.  Review of Systems Denies chest pain or shortness of breath  Past Medical History  Diagnosis Date  . Crohn disease    Past Surgical History  Procedure Laterality Date  . Abdominal surgery     History   Social History  . Marital Status: Married    Spouse Name: N/A    Number of Children: N/A  . Years of Education: N/A   Occupational History  . Not on file.   Social History Main Topics  . Smoking status: Never Smoker   . Smokeless tobacco: Not on file  . Alcohol Use: No  . Drug Use: No  . Sexual Activity: Not on file   Other Topics Concern  . Not on file   Social History Narrative  . No narrative on file   family history is not on file. Current facility-administered medications:0.9 %  sodium chloride infusion, , Intravenous,  Continuous, Nishant Dhungel, MD;  acetaminophen (TYLENOL) suppository 650 mg, 650 mg, Rectal, Q6H PRN, Nishant Dhungel, MD;  acetaminophen (TYLENOL) tablet 650 mg, 650 mg, Oral, Q6H PRN, Nishant Dhungel, MD;  ALPRAZolam (XANAX) tablet 0.5 mg, 0.5 mg, Oral, QHS PRN, Nishant Dhungel, MD clonazePAM (KLONOPIN) tablet 1 mg, 1 mg, Oral, QHS, Nishant Dhungel, MD, 1 mg at 02/07/13 0100;  enoxaparin (LOVENOX) injection 40 mg, 40 mg, Subcutaneous, Q24H, Nishant Dhungel, MD, 40 mg at 02/06/13 2025;  HYDROmorphone (DILAUDID) injection 1 mg, 1 mg, Intravenous, Q3H PRN, Nishant Dhungel, MD, 1 mg at 02/06/13 2031 methylPREDNISolone sodium succinate (SOLU-MEDROL) 125 mg/2 mL injection 80 mg, 80 mg, Intravenous, Q6H, Nishant Dhungel, MD, 80 mg at 02/07/13 0719;  ondansetron (ZOFRAN) injection 4 mg, 4 mg, Intravenous, Q6H PRN, Nishant Dhungel, MD;  ondansetron (ZOFRAN) tablet 4 mg, 4 mg, Oral, Q6H PRN, Nishant Dhungel, MD;  pantoprazole (PROTONIX) EC tablet 40 mg, 40 mg, Oral, Daily, Nishant Dhungel, MD, 40 mg at 02/07/13 0940 sodium chloride 0.9 % injection 3 mL, 3 mL, Intravenous, Q12H, Nishant Dhungel, MD Allergies  Allergen Reactions  . Penicillins Other (See Comments)    Digestive tract infections  . Ibuprofen Other (See Comments)    Interacts with chrohns and causes blood loss  . Morphine And Related Hives  . Nsaids Other (See Comments)    Interacts  with chrohns and causes blood loss  . Other Other (See Comments)    Antibiotics:due to chrohns can not take by mouth but can take IV  . Tetracyclines & Related Other (See Comments)    Lost skin when exposed to the sun     Objective:     BP 92/58  Pulse 70  Temp(Src) 97.9 F (36.6 C) (Oral)  Resp 17  Ht 5\' 10"  (1.778 m)  Wt 96.48 kg (212 lb 11.2 oz)  BMI 30.52 kg/m2  SpO2 96%  He does not appear in any acute distress  Nonicteric  Heart regular rhythm no murmurs  Lungs clear  Abdomen: Bowel sounds are normal, soft, not distended, slight  discomfort in the right lower quadrant    Laboratory No components found with this basename: d1      Assessment:     Crohn's disease with probable flare up.  Abnormality and kidney on CT scan etiology unclear      Plan:     Continue observation. Clear liquids. IV Solu-Medrol. Follow clinically.  Lab Results  Component Value Date   HGB 16.1 02/06/2013   HGB 13.8 03/05/2010   HGB 13.5 03/04/2010   HCT 45.1 02/06/2013   HCT 40.4 03/05/2010   HCT 40.4 03/04/2010   ALKPHOS 65 02/06/2013   ALKPHOS 48 03/01/2010   ALKPHOS 63 02/28/2010   AST 20 02/06/2013   AST 12 03/01/2010   AST 16 02/28/2010   ALT 28 02/06/2013   ALT 15 03/01/2010   ALT 17 02/28/2010

## 2013-02-08 DIAGNOSIS — D72829 Elevated white blood cell count, unspecified: Secondary | ICD-10-CM

## 2013-02-08 LAB — PROTEIN S, TOTAL: Protein S Ag, Total: 92 % (ref 60–150)

## 2013-02-08 LAB — COMPREHENSIVE METABOLIC PANEL
ALT: 23 U/L (ref 0–53)
AST: 18 U/L (ref 0–37)
Albumin: 3.1 g/dL — ABNORMAL LOW (ref 3.5–5.2)
Alkaline Phosphatase: 56 U/L (ref 39–117)
BUN: 14 mg/dL (ref 6–23)
CALCIUM: 9.1 mg/dL (ref 8.4–10.5)
CO2: 25 mEq/L (ref 19–32)
Chloride: 107 mEq/L (ref 96–112)
Creatinine, Ser: 1.06 mg/dL (ref 0.50–1.35)
GFR, EST NON AFRICAN AMERICAN: 78 mL/min — AB (ref >90)
GLUCOSE: 138 mg/dL — AB (ref 70–99)
Potassium: 4.5 mEq/L (ref 3.7–5.3)
SODIUM: 143 meq/L (ref 137–147)
Total Bilirubin: 0.5 mg/dL (ref 0.3–1.2)
Total Protein: 6.7 g/dL (ref 6.0–8.3)

## 2013-02-08 LAB — CBC
HEMATOCRIT: 40.8 % (ref 39.0–52.0)
HEMOGLOBIN: 14 g/dL (ref 13.0–17.0)
MCH: 31.4 pg (ref 26.0–34.0)
MCHC: 34.3 g/dL (ref 30.0–36.0)
MCV: 91.5 fL (ref 78.0–100.0)
Platelets: 194 10*3/uL (ref 150–400)
RBC: 4.46 MIL/uL (ref 4.22–5.81)
RDW: 13.3 % (ref 11.5–15.5)
WBC: 20 10*3/uL — ABNORMAL HIGH (ref 4.0–10.5)

## 2013-02-08 LAB — PROTEIN C, TOTAL: PROTEIN C, TOTAL: 93 % (ref 72–160)

## 2013-02-08 LAB — FACTOR 5 LEIDEN

## 2013-02-08 LAB — PROTHROMBIN GENE MUTATION

## 2013-02-08 LAB — LACTATE DEHYDROGENASE: LDH: 335 U/L — ABNORMAL HIGH (ref 94–250)

## 2013-02-08 NOTE — Progress Notes (Signed)
[facility]                                                                              Patient Demographics  Isaac Mckenzie, is a 53 y.o. male, DOB - [birth year], JXB:[id number]  Admit date - 02/06/2013   Admitting Physician Eddie North, MD  Outpatient Primary MD for the patient is Pcp Not In System  LOS - 2   Chief Complaint  Patient presents with  . Abdominal Pain        Assessment & Plan    Principal Problem:   SIRS (systemic inflammatory response syndrome) Active Problems:   Abdominal pain   Crohn's disease  Crohn's Exacerbation -Unclear etiology at this time, appears to be improving -Patient takes remicade -Gastroenterology consulted and following -Continue steroids, Solu-Medrol IV 80 mg every 6 hours and IV zosyn for empiric coverage -Advance diet as tolerated  SIRS- rule out Renal infarct vs. Infection -Lactic acid from 2.6 to 1 -Patient does not have a known source of infection -UA negative, CXR shows no cardiopulm disease, ?adynamic small bowel ileus vs small bowel enteritis -Procalcitonin <0.10  Leukocytosis -likely secondary to acute phase reactant and solumedrol -Will continue to monitor -Patient remains afebrile  ? Renal infarct -CT abd/pelvis: no evidence of right sided renal artery stenosis, dissection; wedge shaped defect is intermediate and possibly infectious -Echo cardiogram conducted, EF 60-65%, no emboli -LDH 365? Will trend -homocystine 14 (WNL) -DRVVT 43.2   Code Status: Full  Family Communication: None at bedside.  Disposition Plan: Admitted  Time Spent in minutes   35 minutes  Procedures  Echocardiogram Study Conclusions - Left ventricle: The cavity size was normal. Wall thickness was normal. Systolic function was normal. The estimated ejection fraction was in the range of 60% to 65%. Wall motion was normal; there were no regional wall motion abnormalities. Doppler parameters are consistent with abnormal left  ventricular relaxation (grade 1 diastolic dysfunction). - Aortic valve: There was no stenosis. - Mitral valve: No significant regurgitation. - Right ventricle: The cavity size was normal. Systolic function was normal. - Pulmonary arteries: No complete TR doppler jet so unable to estimate PA systolic pressure. Impressions: - Normal LV size and systolic function, EF 60-65%. Normal RV size and systolic function. No significant valvular abnormalities.  Consults   Gastroenterology  DVT Prophylaxis  Lovenox  Lab Results  Component Value Date   PLT 194 02/08/2013    Medications  Scheduled Meds: . clonazePAM  1 mg Oral QHS  . enoxaparin (LOVENOX) injection  40 mg Subcutaneous Q24H  . methylPREDNISolone (SOLU-MEDROL) injection  80 mg Intravenous Q6H  . pantoprazole  40 mg Oral Daily  . piperacillin-tazobactam (ZOSYN)  IV  3.375 g Intravenous Q8H  . sodium chloride  3 mL Intravenous Q12H   Continuous Infusions: . sodium chloride 100 mL/hr at 02/08/13 0525   PRN Meds:.acetaminophen, acetaminophen, ALPRAZolam, HYDROmorphone (DILAUDID) injection, ondansetron (ZOFRAN) IV, ondansetron  Antibiotics    Anti-infectives   Start     Dose/Rate Route Frequency Ordered Stop   02/07/13 1400  piperacillin-tazobactam (ZOSYN) IVPB 3.375 g  Status:  Discontinued     3.375 g 100 mL/hr over 30 Minutes Intravenous 3 times per day  02/07/13 1347 02/07/13 1352   02/07/13 1400  piperacillin-tazobactam (ZOSYN) IVPB 3.375 g     3.375 g 12.5 mL/hr over 240 Minutes Intravenous Every 8 hours 02/07/13 1353          Subjective:   Isaac Mckenzie seen and examined today.  Patient states he is feeling improved as compared to admission.  He has been able to tolerate clears.  Objective:   Filed Vitals:   02/07/13 0507 02/07/13 1419 02/07/13 2159 02/08/13 0614  BP: 92/58 123/71 114/60 92/53  Pulse: 70 82 70 60  Temp: 97.9 F (36.6 C) 98.2 F (36.8 C) 98.6 F (37 C) 97 F (36.1 C)  TempSrc: Oral Oral Oral  Oral  Resp: 17 18 18 18   Height:      Weight:      SpO2: 96% 98% 99% 97%    Wt Readings from Last 3 Encounters:  02/06/13 96.48 kg (212 lb 11.2 oz)  08/19/12 90.719 kg (200 lb)  06/24/12 90.719 kg (200 lb)     Intake/Output Summary (Last 24 hours) at 02/08/13 0838 Last data filed at 02/08/13 0600  Gross per 24 hour  Intake   1951 ml  Output    882 ml  Net   1069 ml    Exam  General: Well developed, well nourished, NAD, appears stated age  HEENT: NCAT, PERRLA, EOMI, Anicteic Sclera, mucous membranes moist. No pharyngeal erythema or exudates  Neck: Supple, no JVD, no masses  Cardiovascular: S1 S2 auscultated, no rubs, murmurs or gallops. Regular rate and rhythm.  Respiratory: Clear to auscultation bilaterally with equal chest rise  Abdomen: Soft, tender RLQ, nondistended, + bowel sounds  Extremities: warm dry without cyanosis clubbing or edema  Neuro: AAOx3, cranial nerves grossly intact. Strength 5/5 in patient's upper and lower extremities bilaterally  Skin: Without rashes exudates or nodules  Psych: Normal affect and demeanor with intact judgement and insight, anxious  Data Review   Micro Results No results found for this or any previous visit (from the past 240 hour(s)).  Radiology Reports Ct Abdomen Pelvis W Contrast  02/06/2013   CLINICAL DATA:  Crohn's disease, nausea, vomiting, right lower quadrant pain. No diarrhea.  EXAM: CT ABDOMEN AND PELVIS WITH CONTRAST  TECHNIQUE: Multidetector CT imaging of the abdomen and pelvis was performed using the standard protocol following bolus administration of intravenous contrast.  CONTRAST:  71mL OMNIPAQUE IOHEXOL 300 MG/ML  SOLN  COMPARISON:  DG ABD ACUTE W/CHEST dated 02/06/2013; CT ABD/PELVIS W CM dated 02/28/2010  FINDINGS: Lung bases are clear.  No pericardial fluid.  There are several small hypodensities in the liver unchanged from prior likely representing benign hepatic cysts. Gallbladder , pancreas, spleen, and  adrenal glands are normal.  There is a low-density cortical lesion within the mid right kidney as well as less well-defined cortical lesions in the upper pole of the right kidney. No significant perinephric stranding. No evidence of obstruction. The is finding is new from comparison exam.  Stomach, small bowel, and cecum are normal. There is no evidence of inflammation at the terminal ileum or distal ileum. No fistula or abscess. Beginning in the transverse colon there is a collapse of the colon throughout with mild bowel wall edema. This may be extent a exaggerated to the collapse of the bowel. Single diverticulum along the descending colon.  Abdominal or is normal caliber. No retroperitoneal periportal lymphadenopathy. Base the aorta appear normal.  No free fluid the pelvis. The prostate gland and bladder normal. No pelvic  lymphadenopathy. Insert negative then  IMPRESSION: 1. New cortical hypodensities in the mid and upper right kidney with differential including acute pyelonephritis versus renal infarctions. 2. No evidence of active Crohn's disease in the small bowel or cecum. 3. Mild bowel wall thickening from the hepatic flexure through to the rectum. This likely relates to chronic inflammation from inflammatory bowel disease. This is similar to comparison exam.   Electronically Signed   By: Genevive BiStewart  Edmunds M.D.   On: 02/06/2013 14:16   Dg Abd Acute W/chest  02/06/2013   CLINICAL DATA:  Right lower abdominal pain  EXAM: ACUTE ABDOMEN SERIES (ABDOMEN 2 VIEW & CHEST 1 VIEW)  COMPARISON:  CT abdomen pelvis dated 02/18/2010  FINDINGS: Lungs are clear. No pleural effusion or pneumothorax.  The heart is normal in size.  Nonspecific bowel gas pattern, but without disproportionate small bowel dilatation to suggest small bowel obstruction. Mildly prominent but nondilated loops of small bowel in the left mid abdomen are nonspecific, possibly reflecting adynamic ileus or small bowel enteritis.  No evidence of free air  under the diaphragm on the upright view.  Mild degenerative changes of the visualized thoracolumbar spine.  IMPRESSION: No evidence of acute cardiopulmonary disease.  No evidence of small bowel obstruction or free air.  Possible adynamic small bowel ileus versus small bowel enteritis.   Electronically Signed   By: Charline BillsSriyesh  Krishnan M.D.   On: 02/06/2013 12:26   Ct Angio Abd/pel W/ And/or W/o  02/07/2013   CLINICAL DATA:  Evaluate for right renal artery stenosis or renal infarct, history of Crohn's disease  EXAM: CT ANGIOGRAPHY ABDOMEN WITH CONTRAST  TECHNIQUE: Multidetector CT imaging of the abdomen was performed using the standard protocol during bolus administration of intravenous contrast. Multiplanar reconstructed images including MIPs were obtained and reviewed to evaluate the vascular anatomy.  CONTRAST:  100mL OMNIPAQUE IOHEXOL 350 MG/ML SOLN  COMPARISON:  CT ABD/PELVIS W CM dated 02/06/2013; CT ABD/PELVIS W CM dated 02/28/2010  FINDINGS: Vascular Findings:  Abdominal aorta: Normal caliber of the abdominal aorta. No abdominal aortic dissection or periaortic stranding.  Celiac artery: Widely patent, conventional branching pattern.  SMA: Widely patent, the conventional branching pattern.  Right Renal artery: Solitary; widely patent without hemodynamically significant narrowing. There is no evidence of dissection, vessel irregular or perivascular stranding.  Left Renal artery: Solitary; widely patent without hemodynamically significant narrowing. No evidence of dissection, vessel irregularity or perivascular stranding.  IMA: Widely patent throughout its imaged course.  Venous:  The IVC and bilateral renal veins appear widely patent.  Review of the MIP images confirms the above findings.   --------------------------------------------------------------------------------  Nonvascular Findings:  Normal hepatic contour. There are multiple hypo attenuating lesions scattered within the liver with dominant  approximately 1 cm hypo attenuating (7 Hounsfield unit) lesion compatible with a hepatic cyst (image 7, series 10). Additional subcentimeter hypoattenuating renal lesions are too small to accurately characterize of favored to represent additional hepatic cysts. There is high-density material lying dependently within the gallbladder which is favored to represent vicarious excretion of contrast following recently performed contrast enhanced abdominal CT. No intra or extra hepatic biliary duct dilatation. No ascites.  There is symmetric enhancement and excretion of the bilateral kidneys. Ill-defined wedge-shaped areas of relative hypoattenuation with primarily involving the mid and superior pole of the right kidney appears grossly unchanged (best seen on coronal image 94, series 13). This finding is again without associated perinephric stranding. Subcentimeter (approximately 6 mm) hypoattenuating lesion within in the superior though favored to represent  a renal cyst. No definite renal stones on this postcontrast examination. No urinary obstruction.  Normal appearance of the bilateral kidneys, pancreas and spleen.  Visualized loops of bowel appear normal. No pneumoperitoneum, pneumatosis or portal venous gas. No retroperitoneal or mesenteric lymphadenopathy.  Limited visualization of the lower thorax is negative for focal airspace opacity or pleural effusion. Normal heart size. No pericardial effusion.  No acute or aggressive osseus abnormalities. There is a small focus of subcutaneous emphysema within the subcutaneous tissues of the right lateral lower abdomen (image 125, series 4), presumably at the location of subcutaneous medication administration.  IMPRESSION: Grossly unchanged wedge-shaped defects involving the mid aspect and superior pole of the right kidney without associated perinephric stranding. There is no evidence of right-sided renal artery stenosis, dissection or vessel irregularity to suggest FMD. The  etiology of this apparent wedge shaped defect is indeterminate, and while possibly infectious, further evaluation with cardiac echo could be performed to evaluate for a cardiac source of emboli.   Electronically Signed   By: Simonne Come M.D.   On: 02/07/2013 17:18    CBC  Recent Labs Lab 02/06/13 1156 02/08/13 0605  WBC 15.3* 20.0*  HGB 16.1 14.0  HCT 45.1 40.8  PLT 206 194  MCV 89.7 91.5  MCH 32.0 31.4  MCHC 35.7 34.3  RDW 13.1 13.3  LYMPHSABS 1.4  --   MONOABS 0.8  --   EOSABS 0.0  --   BASOSABS 0.0  --     Chemistries   Recent Labs Lab 02/06/13 1156 02/07/13 0525 02/08/13 0605  NA 138 136* 143  K 3.8 4.6 4.5  CL 101 101 107  CO2 20 22 25   GLUCOSE 105* 132* 138*  BUN 12 11 14   CREATININE 1.19 1.08 1.06  CALCIUM 9.4 8.9 9.1  AST 20  --  18  ALT 28  --  23  ALKPHOS 65  --  56  BILITOT 0.7  --  0.5   ------------------------------------------------------------------------------------------------------------------ estimated creatinine clearance is 92.9 ml/min (by C-G formula based on Cr of 1.06). ------------------------------------------------------------------------------------------------------------------ No results found for this basename: HGBA1C,  in the last 72 hours ------------------------------------------------------------------------------------------------------------------ No results found for this basename: CHOL, HDL, LDLCALC, TRIG, CHOLHDL, LDLDIRECT,  in the last 72 hours ------------------------------------------------------------------------------------------------------------------ No results found for this basename: TSH, T4TOTAL, FREET3, T3FREE, THYROIDAB,  in the last 72 hours ------------------------------------------------------------------------------------------------------------------ No results found for this basename: VITAMINB12, FOLATE, FERRITIN, TIBC, IRON, RETICCTPCT,  in the last 72 hours  Coagulation profile No results found for  this basename: INR, PROTIME,  in the last 168 hours  No results found for this basename: DDIMER,  in the last 72 hours  Cardiac Enzymes No results found for this basename: CK, CKMB, TROPONINI, MYOGLOBIN,  in the last 168 hours ------------------------------------------------------------------------------------------------------------------ No components found with this basename: POCBNP,     Gyneth Hubka D.O. on 02/08/2013 at 8:38 AM  Between 7am to 7pm - Pager - [phone]  After 7pm go to [email or web address] - password [email or web address]  And look for the night coverage person covering for me after hours  [facility] Office  [phone]

## 2013-02-08 NOTE — Progress Notes (Signed)
[facility] Progress Note  Subjective: Passed flatus and had some bowel movement. No vomiting. Tolerated clear liquids.  Objective: Vital signs in last 24 hours: Temp:  [97 F (36.1 C)-98.6 F (37 C)] 97 F (36.1 C) (01/28 16100614) Pulse Rate:  [60-82] 60 (01/28 0614) Resp:  [18] 18 (01/28 0614) BP: (92-123)/(53-71) 92/53 mmHg (01/28 0614) SpO2:  [97 %-99 %] 97 % (01/28 0614) Weight change:    PE: No distress Abdomen soft and non tender  Lab Results: Results for orders placed during the hospital encounter of 02/06/13 (from the past 24 hour(s))  PROCALCITONIN     Status: None   Collection Time    02/07/13  2:45 PM      Result Value Range   Procalcitonin <0.10    CBC     Status: Abnormal   Collection Time    02/08/13  6:05 AM      Result Value Range   WBC 20.0 (*) 4.0 - 10.5 K/uL   RBC 4.46  4.22 - 5.81 MIL/uL   Hemoglobin 14.0  13.0 - 17.0 g/dL   HCT 96.040.8  45.439.0 - 09.852.0 %   MCV 91.5  78.0 - 100.0 fL   MCH 31.4  26.0 - 34.0 pg   MCHC 34.3  30.0 - 36.0 g/dL   RDW 11.913.3  14.711.5 - 82.915.5 %   Platelets 194  150 - 400 K/uL  COMPREHENSIVE METABOLIC PANEL     Status: Abnormal   Collection Time    02/08/13  6:05 AM      Result Value Range   Sodium 143  137 - 147 mEq/L   Potassium 4.5  3.7 - 5.3 mEq/L   Chloride 107  96 - 112 mEq/L   CO2 25  19 - 32 mEq/L   Glucose, Bld 138 (*) 70 - 99 mg/dL   BUN 14  6 - 23 mg/dL   Creatinine, Ser 5.621.06  0.50 - 1.35 mg/dL   Calcium 9.1  8.4 - 13.010.5 mg/dL   Total Protein 6.7  6.0 - 8.3 g/dL   Albumin 3.1 (*) 3.5 - 5.2 g/dL   AST 18  0 - 37 U/L   ALT 23  0 - 53 U/L   Alkaline Phosphatase 56  39 - 117 U/L   Total Bilirubin 0.5  0.3 - 1.2 mg/dL   GFR calc non Af Amer 78 (*) >90 mL/min   GFR calc Af Amer >90  >90 mL/min    Studies/Results: @RISRSLT24 @    Assessment: Crohn's   Plan: Seems to be improving. Continue IV steroids another day. Advance diet. We will probably want to get a small bowel series x ray in the near  future.    Graylin ShiverGANEM,Keirstin Musil F 02/08/2013, 9:47 AM

## 2013-02-09 LAB — CBC
HCT: 40.1 % (ref 39.0–52.0)
Hemoglobin: 13.7 g/dL (ref 13.0–17.0)
MCH: 31.4 pg (ref 26.0–34.0)
MCHC: 34.2 g/dL (ref 30.0–36.0)
MCV: 91.8 fL (ref 78.0–100.0)
PLATELETS: 179 10*3/uL (ref 150–400)
RBC: 4.37 MIL/uL (ref 4.22–5.81)
RDW: 13.2 % (ref 11.5–15.5)
WBC: 17.8 10*3/uL — ABNORMAL HIGH (ref 4.0–10.5)

## 2013-02-09 LAB — BASIC METABOLIC PANEL
BUN: 14 mg/dL (ref 6–23)
CO2: 25 meq/L (ref 19–32)
Calcium: 8.8 mg/dL (ref 8.4–10.5)
Chloride: 108 mEq/L (ref 96–112)
Creatinine, Ser: 1.05 mg/dL (ref 0.50–1.35)
GFR calc Af Amer: >90 mL/min (ref >90)
GFR, EST NON AFRICAN AMERICAN: 79 mL/min — AB (ref >90)
Glucose, Bld: 134 mg/dL — ABNORMAL HIGH (ref 70–99)
Potassium: 4.4 mEq/L (ref 3.7–5.3)
SODIUM: 142 meq/L (ref 137–147)

## 2013-02-09 LAB — PROCALCITONIN

## 2013-02-09 NOTE — Progress Notes (Signed)
Marland Kitchen   [facility]                                                                              Patient Demographics  Isaac Mckenzie, is a 53 y.o. male, DOB - 1960-06-09, VFI:[id number]  Admit date - 02/06/2013   Admitting Physician No admitting provider for patient encounter.  Outpatient Primary MD for the patient is Pcp Not In System  LOS - 3   Chief Complaint  Patient presents with  . Abdominal Pain        Assessment & Plan    Principal Problem:   SIRS (systemic inflammatory response syndrome) Active Problems:   Abdominal pain   Crohn's disease   Leukocytosis, unspecified  Crohn's Exacerbation -Unclear etiology at this time, appears to be improving -Patient takes remicade -Gastroenterology consulted and following -Continue steroids, Solu-Medrol IV 80 mg every 6 hours and IV zosyn for empiric coverage -Advance diet as tolerated -Spoke with Dr. Evette Cristal, upper GI series will be ordered for 1/30  SIRS- rule out Renal infarct vs. Infection -Lactic acid from 2.6 to 1 -Patient does not have a known source of infection -UA negative, CXR shows no cardiopulm disease, ?adynamic small bowel ileus vs small bowel enteritis -Procalcitonin <0.10  Leukocytosis -likely secondary to acute phase reactant and solumedrol -Will continue to monitor -Patient remains afebrile  ? Renal infarct -CT abd/pelvis: no evidence of right sided renal artery stenosis, dissection; wedge shaped defect is intermediate and possibly infectious -Echo cardiogram conducted, EF 60-65%, no emboli -LDH 365? Will trend -homocystine 14 (WNL) -DRVVT 43.2 (not sure what to make of this) -Spoke with urology, Dr. Brunilda Payor, via phone, who did no recommend intervention, unlikely infectious process as patient has no fever and leukocytosis trending downward, may consider future repeat imaging.   Code Status: Full  Family Communication: None at bedside.  Disposition Plan: Admitted  Time Spent in minutes   35  minutes  Procedures  Echocardiogram Study Conclusions - Left ventricle: The cavity size was normal. Wall thickness was normal. Systolic function was normal. The estimated ejection fraction was in the range of 60% to 65%. Wall motion was normal; there were no regional wall motion abnormalities. Doppler parameters are consistent with abnormal left ventricular relaxation (grade 1 diastolic dysfunction). - Aortic valve: There was no stenosis. - Mitral valve: No significant regurgitation. - Right ventricle: The cavity size was normal. Systolic function was normal. - Pulmonary arteries: No complete TR doppler jet so unable to estimate PA systolic pressure. Impressions: - Normal LV size and systolic function, EF 60-65%. Normal RV size and systolic function. No significant valvular abnormalities.  Consults   Gastroenterology Urology, via phone  DVT Prophylaxis  Lovenox  Lab Results  Component Value Date   PLT 179 02/09/2013    Medications  Scheduled Meds: . clonazePAM  1 mg Oral QHS  . enoxaparin (LOVENOX) injection  40 mg Subcutaneous Q24H  . methylPREDNISolone (SOLU-MEDROL) injection  80 mg Intravenous Q6H  . pantoprazole  40 mg Oral Daily  . piperacillin-tazobactam (ZOSYN)  IV  3.375 g Intravenous Q8H  . sodium chloride  3 mL Intravenous Q12H   Continuous Infusions: . sodium chloride 100 mL/hr at 02/09/13 0703  PRN Meds:.acetaminophen, acetaminophen, ALPRAZolam, HYDROmorphone (DILAUDID) injection, ondansetron (ZOFRAN) IV, ondansetron  Antibiotics    Anti-infectives   Start     Dose/Rate Route Frequency Ordered Stop   02/07/13 1400  piperacillin-tazobactam (ZOSYN) IVPB 3.375 g  Status:  Discontinued     3.375 g 100 mL/hr over 30 Minutes Intravenous 3 times per day 02/07/13 1347 02/07/13 1352   02/07/13 1400  piperacillin-tazobactam (ZOSYN) IVPB 3.375 g     3.375 g 12.5 mL/hr over 240 Minutes Intravenous Every 8 hours 02/07/13 1353          Subjective:   Isaac Mckenzie  seen and examined today.  Patient still complains of abdominal pain, particularly after having a full liquid diet.  He states he is having some flatulence and some passage of stool, however, had some passage.    Objective:   Filed Vitals:   02/08/13 0614 02/08/13 1449 02/08/13 2234 02/09/13 0529  BP: 92/53 122/66 104/61 116/66  Pulse: 60 78 70 60  Temp: 97 F (36.1 C) 98.3 F (36.8 C) 97.5 F (36.4 C) 97.7 F (36.5 C)  TempSrc: Oral Oral Oral   Resp: 18 18 17 17   Height:      Weight:      SpO2: 97% 98% 97% 97%    Wt Readings from Last 3 Encounters:  02/06/13 96.48 kg (212 lb 11.2 oz)  08/19/12 90.719 kg (200 lb)  06/24/12 90.719 kg (200 lb)     Intake/Output Summary (Last 24 hours) at 02/09/13 0809 Last data filed at 02/08/13 1555  Gross per 24 hour  Intake   1198 ml  Output    450 ml  Net    748 ml    Exam  General: Well developed, well nourished, NAD, appears stated age  HEENT: NCAT, mucous membranes moist. Neck: Supple, no JVD, no masses  Cardiovascular: S1 S2 auscultated, Regular rate and rhythm.  Respiratory: Clear to auscultation bilaterally with equal chest rise  Abdomen: Soft, tender RLQ, nondistended, + bowel sounds  Extremities: warm dry without cyanosis clubbing or edema  Neuro: AAOx3, cranial nerves grossly intact.   Skin: Without rashes exudates or nodules  Psych: Normal affect and demeanor with intact judgement and insight, anxious  Data Review   Micro Results No results found for this or any previous visit (from the past 240 hour(s)).  Radiology Reports Ct Abdomen Pelvis W Contrast  02/06/2013   CLINICAL DATA:  Crohn's disease, nausea, vomiting, right lower quadrant pain. No diarrhea.  EXAM: CT ABDOMEN AND PELVIS WITH CONTRAST  TECHNIQUE: Multidetector CT imaging of the abdomen and pelvis was performed using the standard protocol following bolus administration of intravenous contrast.  CONTRAST:  80mL OMNIPAQUE IOHEXOL 300 MG/ML  SOLN   COMPARISON:  DG ABD ACUTE W/CHEST dated 02/06/2013; CT ABD/PELVIS W CM dated 02/28/2010  FINDINGS: Lung bases are clear.  No pericardial fluid.  There are several small hypodensities in the liver unchanged from prior likely representing benign hepatic cysts. Gallbladder , pancreas, spleen, and adrenal glands are normal.  There is a low-density cortical lesion within the mid right kidney as well as less well-defined cortical lesions in the upper pole of the right kidney. No significant perinephric stranding. No evidence of obstruction. The is finding is new from comparison exam.  Stomach, small bowel, and cecum are normal. There is no evidence of inflammation at the terminal ileum or distal ileum. No fistula or abscess. Beginning in the transverse colon there is a collapse of the colon throughout with  mild bowel wall edema. This may be extent a exaggerated to the collapse of the bowel. Single diverticulum along the descending colon.  Abdominal or is normal caliber. No retroperitoneal periportal lymphadenopathy. Base the aorta appear normal.  No free fluid the pelvis. The prostate gland and bladder normal. No pelvic lymphadenopathy. Insert negative then  IMPRESSION: 1. New cortical hypodensities in the mid and upper right kidney with differential including acute pyelonephritis versus renal infarctions. 2. No evidence of active Crohn's disease in the small bowel or cecum. 3. Mild bowel wall thickening from the hepatic flexure through to the rectum. This likely relates to chronic inflammation from inflammatory bowel disease. This is similar to comparison exam.   Electronically Signed   By: Genevive Bi M.D.   On: 02/06/2013 14:16   Dg Abd Acute W/chest  02/06/2013   CLINICAL DATA:  Right lower abdominal pain  EXAM: ACUTE ABDOMEN SERIES (ABDOMEN 2 VIEW & CHEST 1 VIEW)  COMPARISON:  CT abdomen pelvis dated 02/18/2010  FINDINGS: Lungs are clear. No pleural effusion or pneumothorax.  The heart is normal in size.   Nonspecific bowel gas pattern, but without disproportionate small bowel dilatation to suggest small bowel obstruction. Mildly prominent but nondilated loops of small bowel in the left mid abdomen are nonspecific, possibly reflecting adynamic ileus or small bowel enteritis.  No evidence of free air under the diaphragm on the upright view.  Mild degenerative changes of the visualized thoracolumbar spine.  IMPRESSION: No evidence of acute cardiopulmonary disease.  No evidence of small bowel obstruction or free air.  Possible adynamic small bowel ileus versus small bowel enteritis.   Electronically Signed   By: Charline Bills M.D.   On: 02/06/2013 12:26   Ct Angio Abd/pel W/ And/or W/o  02/07/2013   CLINICAL DATA:  Evaluate for right renal artery stenosis or renal infarct, history of Crohn's disease  EXAM: CT ANGIOGRAPHY ABDOMEN WITH CONTRAST  TECHNIQUE: Multidetector CT imaging of the abdomen was performed using the standard protocol during bolus administration of intravenous contrast. Multiplanar reconstructed images including MIPs were obtained and reviewed to evaluate the vascular anatomy.  CONTRAST:  OMNIPAQUE IOHEXOL 350 MG/ML SOLN  COMPARISON:  CT ABD/PELVIS W CM dated 02/06/2013; CT ABD/PELVIS W CM dated 02/28/2010  FINDINGS: Vascular Findings:  Abdominal aorta: Normal caliber of the abdominal aorta. No abdominal aortic dissection or periaortic stranding.  Celiac artery: Widely patent, conventional branching pattern.  SMA: Widely patent, the conventional branching pattern.  Right Renal artery: Solitary; widely patent without hemodynamically significant narrowing. There is no evidence of dissection, vessel irregular or perivascular stranding.  Left Renal artery: Solitary; widely patent without hemodynamically significant narrowing. No evidence of dissection, vessel irregularity or perivascular stranding.  IMA: Widely patent throughout its imaged course.  Venous:  The IVC and bilateral renal veins appear  widely patent.  Review of the MIP images confirms the above findings.   --------------------------------------------------------------------------------  Nonvascular Findings:  Normal hepatic contour. There are multiple hypo attenuating lesions scattered within the liver with dominant approximately 1 cm hypo attenuating (7 Hounsfield unit) lesion compatible with a hepatic cyst (image 7, series 10). Additional subcentimeter hypoattenuating renal lesions are too small to accurately characterize of favored to represent additional hepatic cysts. There is high-density material lying dependently within the gallbladder which is favored to represent vicarious excretion of contrast following recently performed contrast enhanced abdominal CT. No intra or extra hepatic biliary duct dilatation. No ascites.  There is symmetric enhancement and excretion of the bilateral kidneys.  Ill-defined wedge-shaped areas of relative hypoattenuation with primarily involving the mid and superior pole of the right kidney appears grossly unchanged (best seen on coronal image 94, series 13). This finding is again without associated perinephric stranding. Subcentimeter (approximately 6 mm) hypoattenuating lesion within in the superior though favored to represent a renal cyst. No definite renal stones on this postcontrast examination. No urinary obstruction.  Normal appearance of the bilateral kidneys, pancreas and spleen.  Visualized loops of bowel appear normal. No pneumoperitoneum, pneumatosis or portal venous gas. No retroperitoneal or mesenteric lymphadenopathy.  Limited visualization of the lower thorax is negative for focal airspace opacity or pleural effusion. Normal heart size. No pericardial effusion.  No acute or aggressive osseus abnormalities. There is a small focus of subcutaneous emphysema within the subcutaneous tissues of the right lateral lower abdomen (image 125, series 4), presumably at the location of subcutaneous medication  administration.  IMPRESSION: Grossly unchanged wedge-shaped defects involving the mid aspect and superior pole of the right kidney without associated perinephric stranding. There is no evidence of right-sided renal artery stenosis, dissection or vessel irregularity to suggest FMD. The etiology of this apparent wedge shaped defect is indeterminate, and while possibly infectious, further evaluation with cardiac echo could be performed to evaluate for a cardiac source of emboli.   Electronically Signed   By: Simonne ComeJohn  Watts M.D.   On: 02/07/2013 17:18    CBC  Recent Labs Lab 02/06/13 1156 02/08/13 0605 02/09/13 0645  WBC 15.3* 20.0* 17.8*  HGB 16.1 14.0 13.7  HCT 45.1 40.8 40.1  PLT 206 194 179  MCV 89.7 91.5 91.8  MCH 32.0 31.4 31.4  MCHC 35.7 34.3 34.2  RDW 13.1 13.3 13.2  LYMPHSABS 1.4  --   --   MONOABS 0.8  --   --   EOSABS 0.0  --   --   BASOSABS 0.0  --   --     Chemistries   Recent Labs Lab 02/06/13 1156 02/07/13 0525 02/08/13 0605 02/09/13 0645  NA 138 136* 143 142  K 3.8 4.6 4.5 4.4  CL 101 101 107 108  CO2 20 22 25 25   GLUCOSE 105* 132* 138* 134*  BUN 12 11 14 14   CREATININE 1.19 1.08 1.06 1.05  CALCIUM 9.4 8.9 9.1 8.8  AST 20  --  18  --   ALT 28  --  23  --   ALKPHOS 65  --  56  --   BILITOT 0.7  --  0.5  --    ------------------------------------------------------------------------------------------------------------------ estimated creatinine clearance is 93.7 ml/min (by C-G formula based on Cr of 1.05). ------------------------------------------------------------------------------------------------------------------ No results found for this basename: HGBA1C,  in the last 72 hours ------------------------------------------------------------------------------------------------------------------ No results found for this basename: CHOL, HDL, LDLCALC, TRIG, CHOLHDL, LDLDIRECT,  in the last 72  hours ------------------------------------------------------------------------------------------------------------------ No results found for this basename: TSH, T4TOTAL, FREET3, T3FREE, THYROIDAB,  in the last 72 hours ------------------------------------------------------------------------------------------------------------------ No results found for this basename: VITAMINB12, FOLATE, FERRITIN, TIBC, IRON, RETICCTPCT,  in the last 72 hours  Coagulation profile No results found for this basename: INR, PROTIME,  in the last 168 hours  No results found for this basename: DDIMER,  in the last 72 hours  Cardiac Enzymes No results found for this basename: CK, CKMB, TROPONINI, MYOGLOBIN,  in the last 168 hours ------------------------------------------------------------------------------------------------------------------ No components found with this basename: POCBNP,     Emile Kyllo D.O. on 02/09/2013 at 8:09 AM  Between 7am to 7pm - Pager - [phone]  After 7pm go to [email or web address] - password [email or web address]  And look for the night coverage person covering for me after hours  [facility] Office  [phone]

## 2013-02-09 NOTE — Progress Notes (Signed)
[facility] Progress Note  Subjective: The patient still has some complaints of abdominal discomfort although not as bad as before. He is passing flatus and some stool. The full liquid diet was tolerated although he thinks not as well as the clear liquid diet.  Objective: Vital signs in last 24 hours: Temp:  [97.5 F (36.4 C)-98.3 F (36.8 C)] 98.1 F (36.7 C) (01/29 1024) Pulse Rate:  [60-78] 60 (01/29 1024) Resp:  [17-19] 19 (01/29 1024) BP: (104-122)/(61-70) 114/70 mmHg (01/29 1024) SpO2:  [97 %-98 %] 97 % (01/29 0529) Weight change:    PE:  He is in no distress  Heart regular rhythm no murmurs  Lungs clear  Abdomen: Bowel sounds present, soft, no obvious significant tenderness on palpation  Lab Results: Results for orders placed during the hospital encounter of 02/06/13 (from the past 24 hour(s))  LACTATE DEHYDROGENASE     Status: Abnormal   Collection Time    02/08/13  1:00 PM      Result Value Range   LDH 335 (*) 94 - 250 U/L  CBC     Status: Abnormal   Collection Time    02/09/13  6:45 AM      Result Value Range   WBC 17.8 (*) 4.0 - 10.5 K/uL   RBC 4.37  4.22 - 5.81 MIL/uL   Hemoglobin 13.7  13.0 - 17.0 g/dL   HCT 16.140.1  09.639.0 - 04.552.0 %   MCV 91.8  78.0 - 100.0 fL   MCH 31.4  26.0 - 34.0 pg   MCHC 34.2  30.0 - 36.0 g/dL   RDW 40.913.2  81.111.5 - 91.415.5 %   Platelets 179  150 - 400 K/uL  BASIC METABOLIC PANEL     Status: Abnormal   Collection Time    02/09/13  6:45 AM      Result Value Range   Sodium 142  137 - 147 mEq/L   Potassium 4.4  3.7 - 5.3 mEq/L   Chloride 108  96 - 112 mEq/L   CO2 25  19 - 32 mEq/L   Glucose, Bld 134 (*) 70 - 99 mg/dL   BUN 14  6 - 23 mg/dL   Creatinine, Ser 7.821.05  0.50 - 1.35 mg/dL   Calcium 8.8  8.4 - 95.610.5 mg/dL   GFR calc non Af Amer 79 (*) >90 mL/min   GFR calc Af Amer >90  >90 mL/min  PROCALCITONIN     Status: None   Collection Time    02/09/13  6:45 AM      Result Value Range   Procalcitonin <0.10       Studies/Results: @RISRSLT24 @    Assessment: Crohn's disease  Plan: We will proceed with an upper GI with small bowel follow-through to see if there is any evidence of active disease or stricture in the small bowel. He seems to be responding to intravenous Solu-Medrol.    Graylin ShiverGANEM,Mayelin Panos F 02/09/2013, 12:37 PM  Lab Results  Component Value Date   HGB 13.7 02/09/2013   HGB 14.0 02/08/2013   HGB 16.1 02/06/2013   HCT 40.1 02/09/2013   HCT 40.8 02/08/2013   HCT 45.1 02/06/2013   ALKPHOS 56 02/08/2013   ALKPHOS 65 02/06/2013   ALKPHOS 48 03/01/2010   AST 18 02/08/2013   AST 20 02/06/2013   AST 12 03/01/2010   ALT 23 02/08/2013   ALT 28 02/06/2013   ALT 15 03/01/2010

## 2013-02-10 ENCOUNTER — Inpatient Hospital Stay (HOSPITAL_COMMUNITY): Payer: 59

## 2013-02-10 LAB — CBC
HCT: 38 % — ABNORMAL LOW (ref 39.0–52.0)
Hemoglobin: 12.6 g/dL — ABNORMAL LOW (ref 13.0–17.0)
MCH: 30.7 pg (ref 26.0–34.0)
MCHC: 33.2 g/dL (ref 30.0–36.0)
MCV: 92.7 fL (ref 78.0–100.0)
PLATELETS: 163 10*3/uL (ref 150–400)
RBC: 4.1 MIL/uL — ABNORMAL LOW (ref 4.22–5.81)
RDW: 13.1 % (ref 11.5–15.5)
WBC: 12.1 10*3/uL — AB (ref 4.0–10.5)

## 2013-02-10 NOTE — Progress Notes (Signed)
[facility] Progress Note  Subjective: Feels better.  Objective: Vital signs in last 24 hours: Temp:  [97.6 F (36.4 C)-98.1 F (36.7 C)] 97.6 F (36.4 C) (01/30 1427) Pulse Rate:  [52-74] 62 (01/30 1427) Resp:  [16-20] 18 (01/30 1427) BP: (100-130)/(55-79) 130/79 mmHg (01/30 1427) SpO2:  [94 %-96 %] 94 % (01/30 1427) Weight change:    PE: Abdomen not distended. Soft and not tender. UGI and small bowel series seen. No evidence of active Crohn's and no stricture or obstruction  Lab Results: Results for orders placed during the hospital encounter of 02/06/13 (from the past 24 hour(s))  CBC     Status: Abnormal   Collection Time    02/10/13  4:06 AM      Result Value Range   WBC 12.1 (*) 4.0 - 10.5 K/uL   RBC 4.10 (*) 4.22 - 5.81 MIL/uL   Hemoglobin 12.6 (*) 13.0 - 17.0 g/dL   HCT 36.6 (*) 44.0 - 34.7 %   MCV 92.7  78.0 - 100.0 fL   MCH 30.7  26.0 - 34.0 pg   MCHC 33.2  30.0 - 36.0 g/dL   RDW 42.5  95.6 - 38.7 %   Platelets 163  150 - 400 K/uL    Studies/Results: @RISRSLT24 @    Assessment: Crohn's.  There is no obvious evidence of active disease or obstruction of the bowel.  Plan: Advance diet as tolerated. I think that the steroids can be stopped. His symptoms of what he felt was a blockage could have been from adhesions. Home soon. He has a follow up with Dr Randa Evens on Feb. 5th and he should keep that. Call us if needed.    Graylin Shiver 02/10/2013, 4:42 PM  Lab Results  Component Value Date   HGB 12.6* 02/10/2013   HGB 13.7 02/09/2013   HGB 14.0 02/08/2013   HCT 38.0* 02/10/2013   HCT 40.1 02/09/2013   HCT 40.8 02/08/2013   ALKPHOS 56 02/08/2013   ALKPHOS 65 02/06/2013   ALKPHOS 48 03/01/2010   AST 18 02/08/2013   AST 20 02/06/2013   AST 12 03/01/2010   ALT 23 02/08/2013   ALT 28 02/06/2013   ALT 15 03/01/2010

## 2013-02-10 NOTE — Progress Notes (Signed)
UR completed.  Sharnay Cashion, RN BSN [facility] CCM Trauma/Neuro ICU Case Manager [phone]  

## 2013-02-10 NOTE — Progress Notes (Signed)
[facility]                                                                              Patient Demographics  Isaac Mckenzie, is a 53 y.o. male, DOB - January 27, 1960, ZOX:[id number]  Admit date - 02/06/2013   Admitting Physician No admitting provider for patient encounter.  Outpatient Primary MD for the patient is Pcp Not In System  LOS - 4   Chief Complaint  Patient presents with  . Abdominal Pain        Assessment & Plan    Principal Problem:   SIRS (systemic inflammatory response syndrome) Active Problems:   Abdominal pain   Crohn's disease   Leukocytosis, unspecified  Crohn's Exacerbation -Unclear etiology at this time, appears to be improving -Patient takes remicade -Gastroenterology consulted and following -Continue steroids, Solu-Medrol IV 80 mg every 6 hours and IV zosyn for empiric coverage -Advance diet as tolerated, patient still complains of full liquid diet -Spoke with Dr. Evette Cristal, upper GI with small bowel follow-through today  SIRS- rule out Renal infarct vs. Infection -Lactic acid from 2.6 to 1 -Patient does not have a known source of infection -UA negative, CXR shows no cardiopulm disease, ?adynamic small bowel ileus vs small bowel enteritis -Procalcitonin <0.10  Leukocytosis -Improving and trending downward, WBC 12.1 -likely secondary to acute phase reactant and solumedrol -Will continue to monitor -Patient remains afebrile  ? Renal infarct -CT abd/pelvis: no evidence of right sided renal artery stenosis, dissection; wedge shaped defect is intermediate and possibly infectious -Echo cardiogram conducted, EF 60-65%, no emboli -LDH 365? Will trend -homocystine 14 (WNL) -DRVVT 43.2 (not sure what to make of this) -Spoke with urology, Dr. Brunilda Payor, via phone, who did no recommend intervention, unlikely infectious process as patient has no fever and leukocytosis trending downward, may consider future repeat imaging.   Code Status: Full  Family  Communication: None at bedside.  Disposition Plan: Admitted  Time Spent in minutes    Procedures  Echocardiogram Study Conclusions - Left ventricle: The cavity size was normal. Wall thickness was normal. Systolic function was normal. The estimated ejection fraction was in the range of 60% to 65%. Wall motion was normal; there were no regional wall motion abnormalities. Doppler parameters are consistent with abnormal left ventricular relaxation (grade 1 diastolic dysfunction). - Aortic valve: There was no stenosis. - Mitral valve: No significant regurgitation. - Right ventricle: The cavity size was normal. Systolic function was normal. - Pulmonary arteries: No complete TR doppler jet so unable to estimate PA systolic pressure. Impressions: - Normal LV size and systolic function, EF 60-65%. Normal RV size and systolic function. No significant valvular abnormalities.  Consults   Gastroenterology Urology, via phone  DVT Prophylaxis  Lovenox  Lab Results  Component Value Date   PLT 163 02/10/2013    Medications  Scheduled Meds: . clonazePAM  1 mg Oral QHS  . enoxaparin (LOVENOX) injection  40 mg Subcutaneous Q24H  . methylPREDNISolone (SOLU-MEDROL) injection  80 mg Intravenous Q6H  . pantoprazole  40 mg Oral Daily  . piperacillin-tazobactam (ZOSYN)  IV  3.375 g Intravenous Q8H  . sodium chloride  3 mL Intravenous Q12H  Continuous Infusions: . sodium chloride 100 mL/hr at 02/09/13 1739   PRN Meds:.acetaminophen, acetaminophen, ALPRAZolam, HYDROmorphone (DILAUDID) injection, ondansetron (ZOFRAN) IV, ondansetron  Antibiotics    Anti-infectives   Start     Dose/Rate Route Frequency Ordered Stop   02/07/13 1400  piperacillin-tazobactam (ZOSYN) IVPB 3.375 g  Status:  Discontinued     3.375 g 100 mL/hr over 30 Minutes Intravenous 3 times per day 02/07/13 1347 02/07/13 1352   02/07/13 1400  piperacillin-tazobactam (ZOSYN) IVPB 3.375 g     3.375 g 12.5 mL/hr over 240  Minutes Intravenous Every 8 hours 02/07/13 1353          Subjective:   Isaac Mckenzie seen and examined today.  Patient still complains of abdominal pain, particularly after having a full liquid diet. He feels he is only passing air and a tablespoon of "coffee ground powder".  Patient denies nausea and vomiting.   Objective:   Filed Vitals:   02/09/13 1239 02/09/13 1410 02/09/13 2122 02/10/13 0532  BP: 110/68 125/75 124/65 100/55  Pulse: 58 71 74 52  Temp: 98 F (36.7 C) 97.9 F (36.6 C) 98.1 F (36.7 C) 97.8 F (36.6 C)  TempSrc: Oral Oral Oral Oral  Resp: 18 18 16 20   Height:      Weight:      SpO2:  95% 96% 95%    Wt Readings from Last 3 Encounters:  02/06/13 96.48 kg (212 lb 11.2 oz)  08/19/12 90.719 kg (200 lb)  06/24/12 90.719 kg (200 lb)     Intake/Output Summary (Last 24 hours) at 02/10/13 0755 Last data filed at 02/10/13 0600  Gross per 24 hour  Intake 4313.33 ml  Output      2 ml  Net 4311.33 ml    Exam  General: Well developed, well nourished, NAD, appears stated age  HEENT: NCAT, mucous membranes moist.   Neck: Supple, no JVD, no masses  Cardiovascular: S1 S2 auscultated, Regular rate and rhythm.  Respiratory: Clear to auscultation bilaterally with equal chest rise  Abdomen: Soft, tender RLQ, nondistended, + bowel sounds  Extremities: warm dry without cyanosis clubbing or edema  Neuro: AAOx3, cranial nerves grossly intact.   Skin: Without rashes exudates or nodules  Psych: Appropriate mood and affect.  Data Review   Micro Results No results found for this or any previous visit (from the past 240 hour(s)).  Radiology Reports Ct Abdomen Pelvis W Contrast  02/06/2013   CLINICAL DATA:  Crohn's disease, nausea, vomiting, right lower quadrant pain. No diarrhea.  EXAM: CT ABDOMEN AND PELVIS WITH CONTRAST  TECHNIQUE: Multidetector CT imaging of the abdomen and pelvis was performed using the standard protocol following bolus administration of  intravenous contrast.  CONTRAST:  80mL OMNIPAQUE IOHEXOL 300 MG/ML  SOLN  COMPARISON:  DG ABD ACUTE W/CHEST dated 02/06/2013; CT ABD/PELVIS W CM dated 02/28/2010  FINDINGS: Lung bases are clear.  No pericardial fluid.  There are several small hypodensities in the liver unchanged from prior likely representing benign hepatic cysts. Gallbladder , pancreas, spleen, and adrenal glands are normal.  There is a low-density cortical lesion within the mid right kidney as well as less well-defined cortical lesions in the upper pole of the right kidney. No significant perinephric stranding. No evidence of obstruction. The is finding is new from comparison exam.  Stomach, small bowel, and cecum are normal. There is no evidence of inflammation at the terminal ileum or distal ileum. No fistula or abscess. Beginning in the transverse colon there is  a collapse of the colon throughout with mild bowel wall edema. This may be extent a exaggerated to the collapse of the bowel. Single diverticulum along the descending colon.  Abdominal or is normal caliber. No retroperitoneal periportal lymphadenopathy. Base the aorta appear normal.  No free fluid the pelvis. The prostate gland and bladder normal. No pelvic lymphadenopathy. Insert negative then  IMPRESSION: 1. New cortical hypodensities in the mid and upper right kidney with differential including acute pyelonephritis versus renal infarctions. 2. No evidence of active Crohn's disease in the small bowel or cecum. 3. Mild bowel wall thickening from the hepatic flexure through to the rectum. This likely relates to chronic inflammation from inflammatory bowel disease. This is similar to comparison exam.   Electronically Signed   By: Genevive Bi M.D.   On: 02/06/2013 14:16   Dg Abd Acute W/chest  02/06/2013   CLINICAL DATA:  Right lower abdominal pain  EXAM: ACUTE ABDOMEN SERIES (ABDOMEN 2 VIEW & CHEST 1 VIEW)  COMPARISON:  CT abdomen pelvis dated 02/18/2010  FINDINGS: Lungs are clear.  No pleural effusion or pneumothorax.  The heart is normal in size.  Nonspecific bowel gas pattern, but without disproportionate small bowel dilatation to suggest small bowel obstruction. Mildly prominent but nondilated loops of small bowel in the left mid abdomen are nonspecific, possibly reflecting adynamic ileus or small bowel enteritis.  No evidence of free air under the diaphragm on the upright view.  Mild degenerative changes of the visualized thoracolumbar spine.  IMPRESSION: No evidence of acute cardiopulmonary disease.  No evidence of small bowel obstruction or free air.  Possible adynamic small bowel ileus versus small bowel enteritis.   Electronically Signed   By: Charline Bills M.D.   On: 02/06/2013 12:26   Ct Angio Abd/pel W/ And/or W/o  02/07/2013   CLINICAL DATA:  Evaluate for right renal artery stenosis or renal infarct, history of Crohn's disease  EXAM: CT ANGIOGRAPHY ABDOMEN WITH CONTRAST  TECHNIQUE: Multidetector CT imaging of the abdomen was performed using the standard protocol during bolus administration of intravenous contrast. Multiplanar reconstructed images including MIPs were obtained and reviewed to evaluate the vascular anatomy.  CONTRAST:  OMNIPAQUE IOHEXOL 350 MG/ML SOLN  COMPARISON:  CT ABD/PELVIS W CM dated 02/06/2013; CT ABD/PELVIS W CM dated 02/28/2010  FINDINGS: Vascular Findings:  Abdominal aorta: Normal caliber of the abdominal aorta. No abdominal aortic dissection or periaortic stranding.  Celiac artery: Widely patent, conventional branching pattern.  SMA: Widely patent, the conventional branching pattern.  Right Renal artery: Solitary; widely patent without hemodynamically significant narrowing. There is no evidence of dissection, vessel irregular or perivascular stranding.  Left Renal artery: Solitary; widely patent without hemodynamically significant narrowing. No evidence of dissection, vessel irregularity or perivascular stranding.  IMA: Widely patent throughout  its imaged course.  Venous:  The IVC and bilateral renal veins appear widely patent.  Review of the MIP images confirms the above findings.   --------------------------------------------------------------------------------  Nonvascular Findings:  Normal hepatic contour. There are multiple hypo attenuating lesions scattered within the liver with dominant approximately 1 cm hypo attenuating (7 Hounsfield unit) lesion compatible with a hepatic cyst (image 7, series 10). Additional subcentimeter hypoattenuating renal lesions are too small to accurately characterize of favored to represent additional hepatic cysts. There is high-density material lying dependently within the gallbladder which is favored to represent vicarious excretion of contrast following recently performed contrast enhanced abdominal CT. No intra or extra hepatic biliary duct dilatation. No ascites.  There is symmetric  enhancement and excretion of the bilateral kidneys. Ill-defined wedge-shaped areas of relative hypoattenuation with primarily involving the mid and superior pole of the right kidney appears grossly unchanged (best seen on coronal image 94, series 13). This finding is again without associated perinephric stranding. Subcentimeter (approximately 6 mm) hypoattenuating lesion within in the superior though favored to represent a renal cyst. No definite renal stones on this postcontrast examination. No urinary obstruction.  Normal appearance of the bilateral kidneys, pancreas and spleen.  Visualized loops of bowel appear normal. No pneumoperitoneum, pneumatosis or portal venous gas. No retroperitoneal or mesenteric lymphadenopathy.  Limited visualization of the lower thorax is negative for focal airspace opacity or pleural effusion. Normal heart size. No pericardial effusion.  No acute or aggressive osseus abnormalities. There is a small focus of subcutaneous emphysema within the subcutaneous tissues of the right lateral lower abdomen (image  125, series 4), presumably at the location of subcutaneous medication administration.  IMPRESSION: Grossly unchanged wedge-shaped defects involving the mid aspect and superior pole of the right kidney without associated perinephric stranding. There is no evidence of right-sided renal artery stenosis, dissection or vessel irregularity to suggest FMD. The etiology of this apparent wedge shaped defect is indeterminate, and while possibly infectious, further evaluation with cardiac echo could be performed to evaluate for a cardiac source of emboli.   Electronically Signed   By: Simonne ComeJohn  Watts M.D.   On: 02/07/2013 17:18    CBC  Recent Labs Lab 02/06/13 1156 02/08/13 0605 02/09/13 0645 02/10/13 0406  WBC 15.3* 20.0* 17.8* 12.1*  HGB 16.1 14.0 13.7 12.6*  HCT 45.1 40.8 40.1 38.0*  PLT 206 194 179 163  MCV 89.7 91.5 91.8 92.7  MCH 32.0 31.4 31.4 30.7  MCHC 35.7 34.3 34.2 33.2  RDW 13.1 13.3 13.2 13.1  LYMPHSABS 1.4  --   --   --   MONOABS 0.8  --   --   --   EOSABS 0.0  --   --   --   BASOSABS 0.0  --   --   --     Chemistries   Recent Labs Lab 02/06/13 1156 02/07/13 0525 02/08/13 0605 02/09/13 0645  NA 138 136* 143 142  K 3.8 4.6 4.5 4.4  CL 101 101 107 108  CO2 20 22 25 25   GLUCOSE 105* 132* 138* 134*  BUN 12 11 14 14   CREATININE 1.19 1.08 1.06 1.05  CALCIUM 9.4 8.9 9.1 8.8  AST 20  --  18  --   ALT 28  --  23  --   ALKPHOS 65  --  56  --   BILITOT 0.7  --  0.5  --    ------------------------------------------------------------------------------------------------------------------ estimated creatinine clearance is 93.7 ml/min (by C-G formula based on Cr of 1.05). ------------------------------------------------------------------------------------------------------------------ No results found for this basename: HGBA1C,  in the last 72 hours ------------------------------------------------------------------------------------------------------------------ No results found for  this basename: CHOL, HDL, LDLCALC, TRIG, CHOLHDL, LDLDIRECT,  in the last 72 hours ------------------------------------------------------------------------------------------------------------------ No results found for this basename: TSH, T4TOTAL, FREET3, T3FREE, THYROIDAB,  in the last 72 hours ------------------------------------------------------------------------------------------------------------------ No results found for this basename: VITAMINB12, FOLATE, FERRITIN, TIBC, IRON, RETICCTPCT,  in the last 72 hours  Coagulation profile No results found for this basename: INR, PROTIME,  in the last 168 hours  No results found for this basename: DDIMER,  in the last 72 hours  Cardiac Enzymes No results found for this basename: CK, CKMB, TROPONINI, MYOGLOBIN,  in the last 168 hours ------------------------------------------------------------------------------------------------------------------  No components found with this basename: POCBNP,     Jniyah Dantuono D.O. on 02/10/2013 at 7:55 AM  Between 7am to 7pm - Pager - [phone]  After 7pm go to [email or web address] - password [email or web address]  And look for the night coverage person covering for me after hours  [facility] Office  [phone]

## 2013-02-11 LAB — PROCALCITONIN: Procalcitonin: <0.1 ng/mL

## 2013-02-11 NOTE — Progress Notes (Addendum)
[facility]                                                                              Patient Demographics  Isaac Mckenzie, is a 53 y.o. male, DOB - 10-25-1960, ZOX:[id number]  Admit date - 02/06/2013   Admitting Physician No admitting provider for patient encounter.  Outpatient Primary MD for the patient is Pcp Not In System  LOS - 5   Chief Complaint  Patient presents with  . Abdominal Pain        Assessment & Plan    Principal Problem:   SIRS (systemic inflammatory response syndrome) Active Problems:   Abdominal pain   Crohn's disease   Leukocytosis, unspecified  Crohn's Exacerbation -Unclear etiology at this time, appears to be improving -per GI, may be secondary to blockage from adhesions -Upper GI and SB follow through showed: normal upper GI and appearance of small bowel -Patient will need to follow up with Dr. Randa Evens on Feb 5 -Will discontinue steroids and continue IV zosyn -Patient takes remicade -Will advance diet as tolerated  SIRS- rule out Renal infarct vs. Infection -Lactic acid from 2.6 to 1 -Patient does not have a known source of infection -UA negative, CXR shows no cardiopulm disease, ?adynamic small bowel ileus vs small bowel enteritis -Procalcitonin <0.10  Leukocytosis -Improving and trending downward, WBC 12.1 -likely secondary to acute phase reactant and solumedrol -Will continue to monitor -Patient remains afebrile  ? Renal infarct -CT abd/pelvis: no evidence of right sided renal artery stenosis, dissection; wedge shaped defect is intermediate and possibly infectious -Echo cardiogram conducted, EF 60-65%, no emboli -LDH 365? Will trend -homocystine 14 (WNL) -DRVVT 43.2 (not sure what to make of this) -Spoke with urology, Dr. Brunilda Payor, via phone, who did no recommend intervention, unlikely infectious process as patient has no fever and leukocytosis trending downward, may consider future repeat imaging.   Code Status:  Full  Family Communication: None at bedside.  Disposition Plan: Admitted, discharge hopefully within 1-2 days  Time Spent in minutes    Procedures  Echocardiogram Study Conclusions - Left ventricle: The cavity size was normal. Wall thickness was normal. Systolic function was normal. The estimated ejection fraction was in the range of 60% to 65%. Wall motion was normal; there were no regional wall motion abnormalities. Doppler parameters are consistent with abnormal left ventricular relaxation (grade 1 diastolic dysfunction). - Aortic valve: There was no stenosis. - Mitral valve: No significant regurgitation. - Right ventricle: The cavity size was normal. Systolic function was normal. - Pulmonary arteries: No complete TR doppler jet so unable to estimate PA systolic pressure. Impressions: - Normal LV size and systolic function, EF 60-65%. Normal RV size and systolic function. No significant valvular abnormalities.  Upper GI Series w/ Small bowel follow through IMPRESSION:  1. Normal upper GI.  2. Normal appearance of small bowel. Prompt transit time to the  colon, less than 30 min.  3. Grossly normal proximal colon. I do note that the sigmoid colon is redundant and extends across midline into the right lower  quadrant - the area of pain. If the patient does not improve or  symptoms recur, sigmoidoscopy may be  valuable.  Consults   Gastroenterology Urology, via phone  DVT Prophylaxis  Lovenox  Lab Results  Component Value Date   PLT 163 02/10/2013    Medications  Scheduled Meds: . clonazePAM  1 mg Oral QHS  . enoxaparin (LOVENOX) injection  40 mg Subcutaneous Q24H  . pantoprazole  40 mg Oral Daily  . piperacillin-tazobactam (ZOSYN)  IV  3.375 g Intravenous Q8H  . sodium chloride  3 mL Intravenous Q12H   Continuous Infusions: . sodium chloride 100 mL/hr at 02/10/13 1350   PRN Meds:.acetaminophen, acetaminophen, ALPRAZolam, HYDROmorphone (DILAUDID) injection,  ondansetron (ZOFRAN) IV, ondansetron  Antibiotics    Anti-infectives   Start     Dose/Rate Route Frequency Ordered Stop   02/07/13 1400  piperacillin-tazobactam (ZOSYN) IVPB 3.375 g  Status:  Discontinued     3.375 g 100 mL/hr over 30 Minutes Intravenous 3 times per day 02/07/13 1347 02/07/13 1352   02/07/13 1400  piperacillin-tazobactam (ZOSYN) IVPB 3.375 g     3.375 g 12.5 mL/hr over 240 Minutes Intravenous Every 8 hours 02/07/13 1353          Subjective:   Isaac Mckenzie seen and examined today.  Patient states he still have abdominal pain.  He was able to eat and tolerate a full liquid diet.  He is frustrated that he is not improving.    Objective:   Filed Vitals:   02/10/13 0532 02/10/13 1427 02/10/13 2124 02/11/13 0527  BP: 100/55 130/79 126/83 110/64  Pulse: 52 62 64 78  Temp: 97.8 F (36.6 C) 97.6 F (36.4 C) 97.8 F (36.6 C) 98.2 F (36.8 C)  TempSrc: Oral Oral Oral Oral  Resp: 20 18 17 16   Height:      Weight:      SpO2: 95% 94% 94% 97%    Wt Readings from Last 3 Encounters:  02/06/13 96.48 kg (212 lb 11.2 oz)  08/19/12 90.719 kg (200 lb)  06/24/12 90.719 kg (200 lb)     Intake/Output Summary (Last 24 hours) at 02/11/13 0820 Last data filed at 02/11/13 0538  Gross per 24 hour  Intake 3028.33 ml  Output      0 ml  Net 3028.33 ml    Exam  General: Well developed, well nourished, NAD, appears stated age  HEENT: NCAT, mucous membranes moist.   Neck: Supple, no JVD, no masses  Cardiovascular: S1 S2 auscultated, Regular rate and rhythm.  Respiratory: Clear to auscultation bilaterally with equal chest rise  Abdomen: Soft, nontender, nondistended, + bowel sounds  Extremities: warm dry without cyanosis clubbing or edema  Neuro: AAOx3, cranial nerves grossly intact.   Skin: Without rashes exudates or nodules  Psych: Appropriate mood and affect.  Data Review   Micro Results No results found for this or any previous visit (from the past 240  hour(s)).  Radiology Reports Ct Abdomen Pelvis W Contrast  02/06/2013   CLINICAL DATA:  Crohn's disease, nausea, vomiting, right lower quadrant pain. No diarrhea.  EXAM: CT ABDOMEN AND PELVIS WITH CONTRAST  TECHNIQUE: Multidetector CT imaging of the abdomen and pelvis was performed using the standard protocol following bolus administration of intravenous contrast.  CONTRAST:  80mL OMNIPAQUE IOHEXOL 300 MG/ML  SOLN  COMPARISON:  DG ABD ACUTE W/CHEST dated 02/06/2013; CT ABD/PELVIS W CM dated 02/28/2010  FINDINGS: Lung bases are clear.  No pericardial fluid.  There are several small hypodensities in the liver unchanged from prior likely representing benign hepatic cysts. Gallbladder , pancreas, spleen, and adrenal glands are normal.  There is a low-density cortical lesion within the mid right kidney as well as less well-defined cortical lesions in the upper pole of the right kidney. No significant perinephric stranding. No evidence of obstruction. The is finding is new from comparison exam.  Stomach, small bowel, and cecum are normal. There is no evidence of inflammation at the terminal ileum or distal ileum. No fistula or abscess. Beginning in the transverse colon there is a collapse of the colon throughout with mild bowel wall edema. This may be extent a exaggerated to the collapse of the bowel. Single diverticulum along the descending colon.  Abdominal or is normal caliber. No retroperitoneal periportal lymphadenopathy. Base the aorta appear normal.  No free fluid the pelvis. The prostate gland and bladder normal. No pelvic lymphadenopathy. Insert negative then  IMPRESSION: 1. New cortical hypodensities in the mid and upper right kidney with differential including acute pyelonephritis versus renal infarctions. 2. No evidence of active Crohn's disease in the small bowel or cecum. 3. Mild bowel wall thickening from the hepatic flexure through to the rectum. This likely relates to chronic inflammation from  inflammatory bowel disease. This is similar to comparison exam.   Electronically Signed   By: Genevive Bi M.D.   On: 02/06/2013 14:16   Dg Abd Acute W/chest  02/06/2013   CLINICAL DATA:  Right lower abdominal pain  EXAM: ACUTE ABDOMEN SERIES (ABDOMEN 2 VIEW & CHEST 1 VIEW)  COMPARISON:  CT abdomen pelvis dated 02/18/2010  FINDINGS: Lungs are clear. No pleural effusion or pneumothorax.  The heart is normal in size.  Nonspecific bowel gas pattern, but without disproportionate small bowel dilatation to suggest small bowel obstruction. Mildly prominent but nondilated loops of small bowel in the left mid abdomen are nonspecific, possibly reflecting adynamic ileus or small bowel enteritis.  No evidence of free air under the diaphragm on the upright view.  Mild degenerative changes of the visualized thoracolumbar spine.  IMPRESSION: No evidence of acute cardiopulmonary disease.  No evidence of small bowel obstruction or free air.  Possible adynamic small bowel ileus versus small bowel enteritis.   Electronically Signed   By: Charline Bills M.D.   On: 02/06/2013 12:26   Ct Angio Abd/pel W/ And/or W/o  02/07/2013   CLINICAL DATA:  Evaluate for right renal artery stenosis or renal infarct, history of Crohn's disease  EXAM: CT ANGIOGRAPHY ABDOMEN WITH CONTRAST  TECHNIQUE: Multidetector CT imaging of the abdomen was performed using the standard protocol during bolus administration of intravenous contrast. Multiplanar reconstructed images including MIPs were obtained and reviewed to evaluate the vascular anatomy.  CONTRAST:  OMNIPAQUE IOHEXOL 350 MG/ML SOLN  COMPARISON:  CT ABD/PELVIS W CM dated 02/06/2013; CT ABD/PELVIS W CM dated 02/28/2010  FINDINGS: Vascular Findings:  Abdominal aorta: Normal caliber of the abdominal aorta. No abdominal aortic dissection or periaortic stranding.  Celiac artery: Widely patent, conventional branching pattern.  SMA: Widely patent, the conventional branching pattern.  Right  Renal artery: Solitary; widely patent without hemodynamically significant narrowing. There is no evidence of dissection, vessel irregular or perivascular stranding.  Left Renal artery: Solitary; widely patent without hemodynamically significant narrowing. No evidence of dissection, vessel irregularity or perivascular stranding.  IMA: Widely patent throughout its imaged course.  Venous:  The IVC and bilateral renal veins appear widely patent.  Review of the MIP images confirms the above findings.   --------------------------------------------------------------------------------  Nonvascular Findings:  Normal hepatic contour. There are multiple hypo attenuating lesions scattered within the liver with dominant approximately  1 cm hypo attenuating (7 Hounsfield unit) lesion compatible with a hepatic cyst (image 7, series 10). Additional subcentimeter hypoattenuating renal lesions are too small to accurately characterize of favored to represent additional hepatic cysts. There is high-density material lying dependently within the gallbladder which is favored to represent vicarious excretion of contrast following recently performed contrast enhanced abdominal CT. No intra or extra hepatic biliary duct dilatation. No ascites.  There is symmetric enhancement and excretion of the bilateral kidneys. Ill-defined wedge-shaped areas of relative hypoattenuation with primarily involving the mid and superior pole of the right kidney appears grossly unchanged (best seen on coronal image 94, series 13). This finding is again without associated perinephric stranding. Subcentimeter (approximately 6 mm) hypoattenuating lesion within in the superior though favored to represent a renal cyst. No definite renal stones on this postcontrast examination. No urinary obstruction.  Normal appearance of the bilateral kidneys, pancreas and spleen.  Visualized loops of bowel appear normal. No pneumoperitoneum, pneumatosis or portal venous gas. No  retroperitoneal or mesenteric lymphadenopathy.  Limited visualization of the lower thorax is negative for focal airspace opacity or pleural effusion. Normal heart size. No pericardial effusion.  No acute or aggressive osseus abnormalities. There is a small focus of subcutaneous emphysema within the subcutaneous tissues of the right lateral lower abdomen (image 125, series 4), presumably at the location of subcutaneous medication administration.  IMPRESSION: Grossly unchanged wedge-shaped defects involving the mid aspect and superior pole of the right kidney without associated perinephric stranding. There is no evidence of right-sided renal artery stenosis, dissection or vessel irregularity to suggest FMD. The etiology of this apparent wedge shaped defect is indeterminate, and while possibly infectious, further evaluation with cardiac echo could be performed to evaluate for a cardiac source of emboli.   Electronically Signed   By: Simonne Come M.D.   On: 02/07/2013 17:18    CBC  Recent Labs Lab 02/06/13 1156 02/08/13 0605 02/09/13 0645 02/10/13 0406  WBC 15.3* 20.0* 17.8* 12.1*  HGB 16.1 14.0 13.7 12.6*  HCT 45.1 40.8 40.1 38.0*  PLT 206 194 179 163  MCV 89.7 91.5 91.8 92.7  MCH 32.0 31.4 31.4 30.7  MCHC 35.7 34.3 34.2 33.2  RDW 13.1 13.3 13.2 13.1  LYMPHSABS 1.4  --   --   --   MONOABS 0.8  --   --   --   EOSABS 0.0  --   --   --   BASOSABS 0.0  --   --   --     Chemistries   Recent Labs Lab 02/06/13 1156 02/07/13 0525 02/08/13 0605 02/09/13 0645  NA 138 136* 143 142  K 3.8 4.6 4.5 4.4  CL 101 101 107 108  CO2 20 22 25 25   GLUCOSE 105* 132* 138* 134*  BUN 12 11 14 14   CREATININE 1.19 1.08 1.06 1.05  CALCIUM 9.4 8.9 9.1 8.8  AST 20  --  18  --   ALT 28  --  23  --   ALKPHOS 65  --  56  --   BILITOT 0.7  --  0.5  --    ------------------------------------------------------------------------------------------------------------------ estimated creatinine clearance is 93.7  ml/min (by C-G formula based on Cr of 1.05). ------------------------------------------------------------------------------------------------------------------ No results found for this basename: HGBA1C,  in the last 72 hours ------------------------------------------------------------------------------------------------------------------ No results found for this basename: CHOL, HDL, LDLCALC, TRIG, CHOLHDL, LDLDIRECT,  in the last 72 hours ------------------------------------------------------------------------------------------------------------------ No results found for this basename: TSH, T4TOTAL, FREET3, T3FREE, THYROIDAB,  in  the last 72 hours ------------------------------------------------------------------------------------------------------------------ No results found for this basename: VITAMINB12, FOLATE, FERRITIN, TIBC, IRON, RETICCTPCT,  in the last 72 hours  Coagulation profile No results found for this basename: INR, PROTIME,  in the last 168 hours  No results found for this basename: DDIMER,  in the last 72 hours  Cardiac Enzymes No results found for this basename: CK, CKMB, TROPONINI, MYOGLOBIN,  in the last 168 hours ------------------------------------------------------------------------------------------------------------------ No components found with this basename: POCBNP,     Cortnee Steinmiller D.O. on 02/11/2013 at 8:20 AM  Between 7am to 7pm - Pager - [phone]  After 7pm go to [email or web address] - password [email or web address]  And look for the night coverage person covering for me after hours  [facility] Office  [phone]

## 2013-02-12 LAB — CBC
HCT: 38 % — ABNORMAL LOW (ref 39.0–52.0)
Hemoglobin: 12.8 g/dL — ABNORMAL LOW (ref 13.0–17.0)
MCH: 30.8 pg (ref 26.0–34.0)
MCHC: 33.7 g/dL (ref 30.0–36.0)
MCV: 91.3 fL (ref 78.0–100.0)
PLATELETS: 153 10*3/uL (ref 150–400)
RBC: 4.16 MIL/uL — AB (ref 4.22–5.81)
RDW: 13.2 % (ref 11.5–15.5)
WBC: 8.1 10*3/uL (ref 4.0–10.5)

## 2013-02-12 LAB — BASIC METABOLIC PANEL
BUN: 14 mg/dL (ref 6–23)
CO2: 27 meq/L (ref 19–32)
Calcium: 8.2 mg/dL — ABNORMAL LOW (ref 8.4–10.5)
Chloride: 107 mEq/L (ref 96–112)
Creatinine, Ser: 1.22 mg/dL (ref 0.50–1.35)
GFR calc Af Amer: 76 mL/min — ABNORMAL LOW (ref >90)
GFR calc non Af Amer: 66 mL/min — ABNORMAL LOW (ref >90)
Glucose, Bld: 87 mg/dL (ref 70–99)
Potassium: 3.5 mEq/L — ABNORMAL LOW (ref 3.7–5.3)
SODIUM: 144 meq/L (ref 137–147)

## 2013-02-12 MED ORDER — ONDANSETRON HCL 4 MG PO TABS: 4.0000 mg | ORAL_TABLET | Freq: Four times a day (QID) | ORAL | Status: DC | PRN

## 2013-02-12 MED ORDER — OXYCODONE-ACETAMINOPHEN 5-325 MG PO TABS: 1.0000 | ORAL_TABLET | ORAL | Status: DC | PRN

## 2013-02-12 NOTE — Discharge Summary (Signed)
Physician Discharge Summary  Isaac Mckenzie ZOX:[id number] DOB: 08/13/1960 DOA: 02/06/2013  PCP: Pcp Not In System  Admit date: 02/06/2013 Discharge date: 02/12/2013  Time spent: 40 minutes  Recommendations for Outpatient Follow-up:  Patient will be discharged home. He is to followup with his primary care physician within one week of discharge as well as Dr. Randa Evens, gastroenterologist, on 02/16/2013. Patient should continue taking his medications as prescribed. He should also follow a bland diet.   Discharge Diagnoses:  Possible Crohn's exacerbation of unclear etiology, improving SIRS, resolved, no source of infection found Leukocytosis likely reactive, resolved Questionable renal infarct on CT of the abdomen and pelvis, may need outpatient followup  Discharge Condition: Stable  Diet recommendation: bland diet  Filed Weights   02/06/13 1110 02/06/13 1836  Weight: 95.255 kg (210 lb) 96.48 kg (212 lb 11.2 oz)    History of present illness:  52 year ordered a meal with history of Crohn's disease with fistula in ano, follows with Dr Randa Evens with [facility] and on IV remicade every other month (last received about 6 weeks back) presented to the ED lead right mid quadrant abdominal pain, fullness and nausea poor by mouth intake. Patient reports going to a [place] with her family 5 days back when he had some salsa chips and a drink following which he had several episodes of vomiting and started having abdominal discomfort. The pain is primarily located over right mid quadrant and is dull aching and nonradiating. He denies any aggravating or relieving factors. He feels that his symptoms have been similar to his bowel obstruction in the past. Patient reports having difficulty to hold food down and not having any bowel movement for almost 3 days. He reports having a small bowel movement yesterday. He denies any subjective fever or chills. Denies any headache, blurred vision, dizziness,  chest pain, palpitations, shortness of breath, blood in stool, dysuria or hematuria. He does report generalized fatigue. Denies any flank pain but does report having some low back pain 2 weeks back. He reports taking 1-2 tablets of prednisone thinking this to be his Crohn's flare. He denies any change in his medications. She denies any history of blood clots or irreregular heart rhythm.   Hospital Course:  Crohn's Exacerbation  -Unclear etiology at this time, appears to be improving  -per GI, may be secondary to blockage from adhesions  -Upper GI and SB follow through showed: normal upper GI and appearance of small bowel  -Patient was placed on IV Zosyn empirically, however no infectious etiology was found to -Patient takes remicade and steroids -Patient was able to tolerate bland diet and will be discharged to home. -Patient will need to follow up with Dr. Randa Evens on Feb 5  -Patient is being discharged with a limited supply of pain medication as well as Zofran.  SIRS- rule out Renal infarct vs. Infection  -Lactic acid from 2.6 to 1  -Patient does not have a known source of infection  -UA negative, CXR shows no cardiopulm disease, ?adynamic small bowel ileus vs small bowel enteritis  -Procalcitonin <0.10   Leukocytosis  -Improving and trending downward, WBC 12.1  -likely secondary to acute phase reactant and solumedrol  -Will continue to monitor  -Patient remains afebrile   ? Renal infarct  -CT abd/pelvis: no evidence of right sided renal artery stenosis, dissection; wedge shaped defect is intermediate and possibly infectious  -Echo cardiogram conducted, EF 60-65%, no emboli  -LDH 365? Will trend  -homocystine 14 (WNL)  -DRVVT  43.2 (not sure what to make of this)  -Spoke with urology, Dr. Brunilda Payor, via phone, who did no recommend intervention, unlikely infectious process as patient has no fever and leukocytosis trending downward, may consider future repeat imaging.     Procedures: Echocardiogram  Study Conclusions - Left ventricle: The cavity size was normal. Wall thickness was normal. Systolic function was normal. The estimated ejection fraction was in the range of 60% to 65%. Wall motion was normal; there were no regional wall motion abnormalities. Doppler parameters are consistent with abnormal left ventricular relaxation (grade 1 diastolic dysfunction). - Aortic valve: There was no stenosis. - Mitral valve: No significant regurgitation. - Right ventricle: The cavity size was normal. Systolic function was normal. - Pulmonary arteries: No complete TR doppler jet so unable to estimate PA systolic pressure. Impressions: - Normal LV size and systolic function, EF 60-65%. Normal RV size and systolic function. No significant valvular abnormalities.  Upper GI Series w/ Small bowel follow through  IMPRESSION:  1. Normal upper GI.  2. Normal appearance of small bowel. Prompt transit time to the colon, less than 30 min.  3. Grossly normal proximal colon. I do note that the sigmoid colon is redundant and extends across midline into the right lower quadrant - the area of pain. If the patient does not improve or  symptoms recur, sigmoidoscopy may be valuable.    Consultations: Gastroenterology  Urology, via phone   Discharge Exam: Filed Vitals:   02/12/13 0538  BP: 104/56  Pulse: 55  Temp: 97.5 F (36.4 C)  Resp: 18   Exam  General: Well developed, well nourished, NAD, appears stated age  HEENT: NCAT, mucous membranes moist.  Neck: Supple, no JVD, no masses  Cardiovascular: S1 S2 auscultated, Regular rate and rhythm.  Respiratory: Clear to auscultation bilaterally with equal chest rise  Abdomen: Soft, nontender, nondistended, + bowel sounds  Extremities: warm dry without cyanosis clubbing or edema  Neuro: AAOx3, cranial nerves grossly intact.  Skin: Without rashes exudates or nodules  Psych: Appropriate mood and affect.  Discharge  Instructions  Discharge Orders   Future Appointments Provider Department Dept Phone   02/27/2013 10:00 AM [facility] 9 [facility] [phone]   Future Orders Complete By Expires   Discharge instructions  As directed    Comments:     Patient will be discharged home. He is to followup with his primary care physician within one week of discharge as well as Dr. Randa Evens, gastroenterologist, on 02/16/2013. Patient should continue taking his medications as prescribed. He should also follow a bland diet.   Increase activity slowly  As directed        Medication List         ALPRAZolam 0.5 MG tablet  Commonly known as:  XANAX  Take 0.5 mg by mouth at bedtime as needed for anxiety.     CALCIUM & MAGNESIUM CARBONATES PO  Take 1 tablet by mouth daily as needed.     clonazePAM 1 MG tablet  Commonly known as:  KLONOPIN  Take 1 mg by mouth at bedtime.     omeprazole 20 MG capsule  Commonly known as:  PRILOSEC  Take 20 mg by mouth daily as needed (heart burn).     ondansetron 4 MG tablet  Commonly known as:  ZOFRAN  Take 1 tablet (4 mg total) by mouth every 6 (six) hours as needed for nausea.     OVER THE COUNTER MEDICATION  Place 1-2  drops into both eyes daily as needed (dry eyes). Eye drops     oxyCODONE-acetaminophen 5-325 MG per tablet  Commonly known as:  ROXICET  Take 1 tablet by mouth every 4 (four) hours as needed for severe pain.     POTASSIMIN PO  Take 1 tablet by mouth every other day.     prednisoLONE 5 MG Tabs tablet  Take 15-20 mg by mouth daily as needed (flare ups).       Allergies  Allergen Reactions  . Penicillins Other (See Comments)    Digestive tract infections  . Ibuprofen Other (See Comments)    Interacts with chrohns and causes blood loss  . Morphine And Related Hives  . Nsaids Other (See Comments)    Interacts with chrohns and causes blood loss  . Other Other (See Comments)    Antibiotics:due to chrohns can not  take by mouth but can take IV  . Tetracyclines & Related Other (See Comments)    Lost skin when exposed to the sun       Follow-up Information   Follow up with Pcp Not In System.      Follow up with Primary care physician. Schedule an appointment as soon as possible for a visit in 1 week.      Follow up with Vertell Novak, MD. (02/16/2013)    Specialty:  Gastroenterology   Contact information:   [address]                          [place] [address] [phone]        The results of significant diagnostics from this hospitalization (including imaging, microbiology, ancillary and laboratory) are listed below for reference.    Significant Diagnostic Studies: Ct Abdomen Pelvis W Contrast  02/06/2013   CLINICAL DATA:  Crohn's disease, nausea, vomiting, right lower quadrant pain. No diarrhea.  EXAM: CT ABDOMEN AND PELVIS WITH CONTRAST  TECHNIQUE: Multidetector CT imaging of the abdomen and pelvis was performed using the standard protocol following bolus administration of intravenous contrast.  CONTRAST:  80mL OMNIPAQUE IOHEXOL 300 MG/ML  SOLN  COMPARISON:  DG ABD ACUTE W/CHEST dated 02/06/2013; CT ABD/PELVIS W CM dated 02/28/2010  FINDINGS: Lung bases are clear.  No pericardial fluid.  There are several small hypodensities in the liver unchanged from prior likely representing benign hepatic cysts. Gallbladder , pancreas, spleen, and adrenal glands are normal.  There is a low-density cortical lesion within the mid right kidney as well as less well-defined cortical lesions in the upper pole of the right kidney. No significant perinephric stranding. No evidence of obstruction. The is finding is new from comparison exam.  Stomach, small bowel, and cecum are normal. There is no evidence of inflammation at the terminal ileum or distal ileum. No fistula or abscess. Beginning in the transverse colon there is a collapse of the colon throughout with mild bowel wall edema. This may  be extent a exaggerated to the collapse of the bowel. Single diverticulum along the descending colon.  Abdominal or is normal caliber. No retroperitoneal periportal lymphadenopathy. Base the aorta appear normal.  No free fluid the pelvis. The prostate gland and bladder normal. No pelvic lymphadenopathy. Insert negative then  IMPRESSION: 1. New cortical hypodensities in the mid and upper right kidney with differential including acute pyelonephritis versus renal infarctions. 2. No evidence of active Crohn's disease in the small bowel or cecum. 3. Mild bowel wall thickening from  the hepatic flexure through to the rectum. This likely relates to chronic inflammation from inflammatory bowel disease. This is similar to comparison exam.   Electronically Signed   By: Genevive BiStewart  Edmunds M.D.   On: 02/06/2013 14:16   Dg Abd Acute W/chest  02/06/2013   CLINICAL DATA:  Right lower abdominal pain  EXAM: ACUTE ABDOMEN SERIES (ABDOMEN 2 VIEW & CHEST 1 VIEW)  COMPARISON:  CT abdomen pelvis dated 02/18/2010  FINDINGS: Lungs are clear. No pleural effusion or pneumothorax.  The heart is normal in size.  Nonspecific bowel gas pattern, but without disproportionate small bowel dilatation to suggest small bowel obstruction. Mildly prominent but nondilated loops of small bowel in the left mid abdomen are nonspecific, possibly reflecting adynamic ileus or small bowel enteritis.  No evidence of free air under the diaphragm on the upright view.  Mild degenerative changes of the visualized thoracolumbar spine.  IMPRESSION: No evidence of acute cardiopulmonary disease.  No evidence of small bowel obstruction or free air.  Possible adynamic small bowel ileus versus small bowel enteritis.   Electronically Signed   By: Charline BillsSriyesh  Krishnan M.D.   On: 02/06/2013 12:26   Dg Ugi W/small Bowel  02/10/2013   CLINICAL DATA:  53 year old male with history of Crohn disease and recent intolerance of all p.o. intake. Right lower quadrant abdominal pain,  feeling of "Blockage" constipation. Initial encounter. Recent CT without evidence of small bowel inflammation.  EXAM: UPPER GI SERIES WITH SMALL BOWEL FOLLOW-THROUGH  TECHNIQUE: Combined double contrast and single contrast upper GI series using effervescent crystals, thick barium, and thin barium. Subsequently, serial images of the small bowel were obtained including spot views of the terminal ileum.  COMPARISON:  Abdomen CTA 02/07/2013. CT Abdomen and Pelvis 02/06/2013.  FLUOROSCOPY TIME:  3 min and 4 seconds.  FINDINGS: Preprocedural scout view, Non obstructed bowel gas pattern. Abdominal and pelvic visceral contours are within normal limits.  A double contrast upper GI study was undertaken and the patient tolerated this well and without difficulty.  No obstruction to the forward flow of contrast throughout the esophagus and into the stomach. Normal esophageal course and contour. Normal esophageal mucosal pattern. Normal gastroesophageal junction.  Gastric contour and mucosal pattern within normal limits. Prompt gastric emptying.  Duodenum bulb and C-loop have a normal course and contour. Duodenum mucosal pattern is within normal limits.  Small bowel follow-through than was undertaken. At 0 min barium contrast opacifies multiple normal appearing proximal small bowel loops in the epigastrium and left abdomen.  At 30 min oral contrast head reached the distal transverse colon. Small bowel course and contour are normal. No dilated or irregular loops are identified. The visible proximal colon also appears within normal limits.  Additional inspection of the small bowel with fluoroscopy was performed. Normal small bowel peristalsis was noted. No abnormal appearing loops are identified. 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 This area appears normal.  Of note, the sigmoid colon is redundant and extends across midline into the right lower quadrant. There is only early contrast  opacification of the sigmoid, which appears grossly normal.  IMPRESSION: 1. Normal upper GI. 2. Normal appearance of small bowel. Prompt transit time to the colon, less than 30 min. 3. Grossly normal proximal colon. I do note that the sigmoid colon is redundant and extends across midline into the right lower quadrant - the area of pain. If the patient does not improve or symptoms recur, sigmoidoscopy may be valuable.  Electronically Signed   By: Augusto Gamble M.D.   On: 02/10/2013 11:57   Ct Angio Abd/pel W/ And/or W/o  02/07/2013   CLINICAL DATA:  Evaluate for right renal artery stenosis or renal infarct, history of Crohn's disease  EXAM: CT ANGIOGRAPHY ABDOMEN WITH CONTRAST  TECHNIQUE: Multidetector CT imaging of the abdomen was performed using the standard protocol during bolus administration of intravenous contrast. Multiplanar reconstructed images including MIPs were obtained and reviewed to evaluate the vascular anatomy.  CONTRAST:  OMNIPAQUE IOHEXOL 350 MG/ML SOLN  COMPARISON:  CT ABD/PELVIS W CM dated 02/06/2013; CT ABD/PELVIS W CM dated 02/28/2010  FINDINGS: Vascular Findings:  Abdominal aorta: Normal caliber of the abdominal aorta. No abdominal aortic dissection or periaortic stranding.  Celiac artery: Widely patent, conventional branching pattern.  SMA: Widely patent, the conventional branching pattern.  Right Renal artery: Solitary; widely patent without hemodynamically significant narrowing. There is no evidence of dissection, vessel irregular or perivascular stranding.  Left Renal artery: Solitary; widely patent without hemodynamically significant narrowing. No evidence of dissection, vessel irregularity or perivascular stranding.  IMA: Widely patent throughout its imaged course.  Venous:  The IVC and bilateral renal veins appear widely patent.  Review of the MIP images confirms the above findings.   --------------------------------------------------------------------------------  Nonvascular  Findings:  Normal hepatic contour. There are multiple hypo attenuating lesions scattered within the liver with dominant approximately 1 cm hypo attenuating (7 Hounsfield unit) lesion compatible with a hepatic cyst (image 7, series 10). Additional subcentimeter hypoattenuating renal lesions are too small to accurately characterize of favored to represent additional hepatic cysts. There is high-density material lying dependently within the gallbladder which is favored to represent vicarious excretion of contrast following recently performed contrast enhanced abdominal CT. No intra or extra hepatic biliary duct dilatation. No ascites.  There is symmetric enhancement and excretion of the bilateral kidneys. Ill-defined wedge-shaped areas of relative hypoattenuation with primarily involving the mid and superior pole of the right kidney appears grossly unchanged (best seen on coronal image 94, series 13). This finding is again without associated perinephric stranding. Subcentimeter (approximately 6 mm) hypoattenuating lesion within in the superior though favored to represent a renal cyst. No definite renal stones on this postcontrast examination. No urinary obstruction.  Normal appearance of the bilateral kidneys, pancreas and spleen.  Visualized loops of bowel appear normal. No pneumoperitoneum, pneumatosis or portal venous gas. No retroperitoneal or mesenteric lymphadenopathy.  Limited visualization of the lower thorax is negative for focal airspace opacity or pleural effusion. Normal heart size. No pericardial effusion.  No acute or aggressive osseus abnormalities. There is a small focus of subcutaneous emphysema within the subcutaneous tissues of the right lateral lower abdomen (image 125, series 4), presumably at the location of subcutaneous medication administration.  IMPRESSION: Grossly unchanged wedge-shaped defects involving the mid aspect and superior pole of the right kidney without associated perinephric  stranding. There is no evidence of right-sided renal artery stenosis, dissection or vessel irregularity to suggest FMD. The etiology of this apparent wedge shaped defect is indeterminate, and while possibly infectious, further evaluation with cardiac echo could be performed to evaluate for a cardiac source of emboli.   Electronically Signed   By: Simonne Come M.D.   On: 02/07/2013 17:18    Microbiology: No results found for this or any previous visit (from the past 240 hour(s)).   Labs: Basic Metabolic Panel:  Recent Labs Lab 02/06/13 1156 02/07/13 0525 02/08/13 0605 02/09/13 0645 02/12/13 0335  NA 138 136* 143  142 144  K 3.8 4.6 4.5 4.4 3.5*  CL 101 101 107 108 107  CO2 20 22 25 25 27   GLUCOSE 105* 132* 138* 134* 87  BUN 12 11 14 14 14   CREATININE 1.19 1.08 1.06 1.05 1.22  CALCIUM 9.4 8.9 9.1 8.8 8.2*   Liver Function Tests:  Recent Labs Lab 02/06/13 1156 02/08/13 0605  AST 20 18  ALT 28 23  ALKPHOS 65 56  BILITOT 0.7 0.5  PROT 7.8 6.7  ALBUMIN 3.8 3.1*    Recent Labs Lab 02/06/13 1156  LIPASE 30   No results found for this basename: AMMONIA,  in the last 168 hours CBC:  Recent Labs Lab 02/06/13 1156 02/08/13 0605 02/09/13 0645 02/10/13 0406 02/12/13 0335  WBC 15.3* 20.0* 17.8* 12.1* 8.1  NEUTROABS 13.0*  --   --   --   --   HGB 16.1 14.0 13.7 12.6* 12.8*  HCT 45.1 40.8 40.1 38.0* 38.0*  MCV 89.7 91.5 91.8 92.7 91.3  PLT 206 194 179 163 153   Cardiac Enzymes: No results found for this basename: CKTOTAL, CKMB, CKMBINDEX, TROPONINI,  in the last 168 hours BNP: BNP (last 3 results) No results found for this basename: PROBNP,  in the last 8760 hours CBG: No results found for this basename: GLUCAP,  in the last 168 hours     Signed:  Edsel Petrin  [facility] 02/12/2013, 10:16 AM

## 2013-02-12 NOTE — Discharge Instructions (Signed)
Crohn's Disease Crohn's disease is a long-term (chronic) soreness and redness (inflammation) of the intestines (bowel). It can affect any portion of the digestive tract, from the mouth to the anus. It can also cause problems outside the digestive tract. Crohn's disease is closely related to a disease called ulcerative colitis (together, these two diseases are called inflammatory bowel disease).  CAUSES  The cause of Crohn's disease is not known. One theory is that, in an easily affected person, the immune system is triggered to attack the body's own digestive tissue. Crohn's disease runs in families. It seems to be more common in certain geographic areas and amongst certain races. There are no clear-cut dietary causes.  SYMPTOMS  Crohn's disease can cause many different symptoms since it can affect many different parts of the body. Symptoms include:  Fatigue.  Weight loss.  Chronic diarrhea, sometime bloody.  Abdominal pain and cramps.  Fever.  Ulcers or canker sores in the mouth or rectum.  Anemia (low red blood cells).  Arthritis, skin problems, and eye problems may occur. Complications of Crohn's disease can include:  Series of holes (perforation) of the bowel.  Portions of the intestines sticking to each other (adhesions).  Obstruction of the bowel.  Fistula formation, typically in the rectal area but also in other areas. A fistula is an opening between the bowels and the outside, or between the bowels and another organ.  A painful crack in the mucous membrane of the anus (rectal fissure). DIAGNOSIS  Your caregiver may suspect Crohn's disease based on your symptoms and an exam. Blood tests may confirm that there is a problem. You may be asked to submit a stool specimen for examination. X-rays and CT scans may be necessary. Ultimately, the diagnosis is usually made after a procedure that uses a flexible tube that is inserted via your mouth or your anus. This is done under  sedation and is called either an upper endoscopy or colonoscopy. With these tests, the specialist can take tiny tissue samples and remove them from the inside of the bowel (biopsy). Examination of this biopsy tissue under a microscope can reveal Crohn's disease as the cause of your symptoms. Due to the many different forms that Crohn's disease can take, symptoms may be present for several years before a diagnosis is made. TREATMENT  Medications are often used to decrease inflammation and control the immune system. These include medicines related to aspirin, steroid medications, and newer and stronger medications to slow down the immune system. Some medications may be used as suppositories or enemas. A number of other medications are used or have been studied. Your caregiver will make specific recommendations. HOME CARE INSTRUCTIONS   Symptoms such as diarrhea can be controlled with medications. Avoid foods that have a laxative effect such as fresh fruit, vegetables and dairy products. During flare ups, you can rest your bowel by refraining from solid foods. Drink clear liquids frequently during the day (electrolyte or re-hydrating fluids are best. Your caregiver can help you with suggestions). Drink often to prevent loss of body fluids (dehydration). When diarrhea has cleared, eat small meals and more frequently. Avoid food additives and stimulants such as caffeine (coffee, tea, or chocolate). Enzyme supplements may help if you develop intolerance to a sugar in dairy products (lactose). Ask your caregiver or dietitian about specific dietary instructions.  Try to maintain a positive attitude. Learn relaxation techniques such as self hypnosis, mental imaging, and muscle relaxation.  If possible, avoid stresses which can aggravate your condition.    Exercise regularly.  Follow your diet.  Always get plenty of rest. SEEK MEDICAL CARE IF:   Your symptoms fail to improve after a week or two of new  treatment.  You experience continued weight loss.  You have ongoing cramps or loose bowels.  You develop a new skin rash, skin sores, or eye problems. SEEK IMMEDIATE MEDICAL CARE IF:   You have worsening of your symptoms or develop new symptoms.  You have a fever.  You develop bloody diarrhea.  You develop severe abdominal pain. MAKE SURE YOU:   Understand these instructions.  Will watch your condition.  Will get help right away if you are not doing well or get worse. Document Released: 10/08/2004 Document Revised: 04/25/2012 Document Reviewed: 09/06/2006 [employer] Patient Information 2014 [employer].  

## 2013-02-12 NOTE — Progress Notes (Signed)
Discharge instructions were done and went with patient and his wife. Patient was given prescription for roxicet and zofran to take to his pharmacy. Patient was assisted down to his transportation by staff.

## 2013-02-27 ENCOUNTER — Encounter (HOSPITAL_COMMUNITY)
Admission: RE | Admit: 2013-02-27 | Discharge: 2013-02-27 | Disposition: A | Discharge status: Home / Self Care | Payer: 59 | Source: Ambulatory Visit | Attending: Gastroenterology | Admitting: Gastroenterology

## 2013-02-27 DIAGNOSIS — K5090 Crohn's disease, unspecified, without complications: Secondary | ICD-10-CM | POA: Insufficient documentation

## 2013-02-27 MED ORDER — SODIUM CHLORIDE 0.9 % IV SOLN
800.0000 mg | INTRAVENOUS | Status: AC
  Administered 2013-02-27: 800 mg via INTRAVENOUS
  Filled 2013-02-27: qty 80

## 2013-02-27 MED ORDER — SODIUM CHLORIDE 0.9 % IV SOLN
INTRAVENOUS | Status: AC
  Administered 2013-02-27: 10:00:00 via INTRAVENOUS

## 2013-02-27 MED ORDER — ACETAMINOPHEN 500 MG PO TABS
1000.0000 mg | ORAL_TABLET | ORAL | Status: AC
  Administered 2013-02-27: 1000 mg via ORAL

## 2013-02-27 MED ORDER — DIPHENHYDRAMINE HCL 50 MG/ML IJ SOLN
INTRAMUSCULAR | Status: AC
  Filled 2013-02-27: qty 1

## 2013-02-27 MED ORDER — ACETAMINOPHEN 500 MG PO TABS
ORAL_TABLET | ORAL | Status: AC
  Filled 2013-02-27: qty 2

## 2013-02-27 MED ORDER — DIPHENHYDRAMINE HCL 50 MG/ML IJ SOLN
50.0000 mg | INTRAMUSCULAR | Status: AC
  Administered 2013-02-27: 50 mg via INTRAVENOUS

## 2013-04-24 ENCOUNTER — Encounter (HOSPITAL_COMMUNITY)
Admission: RE | Admit: 2013-04-24 | Discharge: 2013-04-24 | Disposition: A | Discharge status: Home / Self Care | Payer: 59 | Source: Ambulatory Visit | Attending: Gastroenterology | Admitting: Gastroenterology

## 2013-04-24 DIAGNOSIS — Z5181 Encounter for therapeutic drug level monitoring: Secondary | ICD-10-CM | POA: Insufficient documentation

## 2013-04-24 DIAGNOSIS — K5090 Crohn's disease, unspecified, without complications: Secondary | ICD-10-CM | POA: Insufficient documentation

## 2013-04-24 MED ORDER — ACETAMINOPHEN 325 MG PO TABS
ORAL_TABLET | ORAL | Status: AC
  Filled 2013-04-24: qty 2

## 2013-04-24 MED ORDER — SODIUM CHLORIDE 0.9 % IV SOLN
INTRAVENOUS | Status: DC
  Administered 2013-04-24: 11:00:00 via INTRAVENOUS

## 2013-04-24 MED ORDER — ACETAMINOPHEN 325 MG PO TABS
650.0000 mg | ORAL_TABLET | ORAL | Status: DC
  Administered 2013-04-24: 650 mg via ORAL

## 2013-04-24 MED ORDER — DIPHENHYDRAMINE HCL 50 MG/ML IJ SOLN
50.0000 mg | INTRAMUSCULAR | Status: DC
  Administered 2013-04-24: 50 mg via INTRAVENOUS

## 2013-04-24 MED ORDER — SODIUM CHLORIDE 0.9 % IV SOLN
800.0000 mg | INTRAVENOUS | Status: DC
  Administered 2013-04-24: 800 mg via INTRAVENOUS
  Filled 2013-04-24: qty 80

## 2013-04-24 MED ORDER — DIPHENHYDRAMINE HCL 50 MG/ML IJ SOLN
INTRAMUSCULAR | Status: AC
  Filled 2013-04-24: qty 1

## 2013-05-22 ENCOUNTER — Other Ambulatory Visit: Payer: Self-pay | Admitting: Urology

## 2013-05-22 DIAGNOSIS — N280 Ischemia and infarction of kidney: Secondary | ICD-10-CM

## 2013-05-30 ENCOUNTER — Other Ambulatory Visit: Payer: Self-pay | Admitting: Dermatology

## 2013-06-15 ENCOUNTER — Ambulatory Visit (HOSPITAL_COMMUNITY)
Admission: RE | Admit: 2013-06-15 | Discharge: 2013-06-15 | Disposition: A | Discharge status: Home / Self Care | Payer: 59 | Source: Ambulatory Visit | Attending: Urology | Admitting: Urology

## 2013-06-15 DIAGNOSIS — N289 Disorder of kidney and ureter, unspecified: Secondary | ICD-10-CM | POA: Insufficient documentation

## 2013-06-15 DIAGNOSIS — N281 Cyst of kidney, acquired: Secondary | ICD-10-CM | POA: Insufficient documentation

## 2013-06-15 DIAGNOSIS — K7689 Other specified diseases of liver: Secondary | ICD-10-CM | POA: Insufficient documentation

## 2013-06-15 DIAGNOSIS — K5090 Crohn's disease, unspecified, without complications: Secondary | ICD-10-CM | POA: Insufficient documentation

## 2013-06-15 DIAGNOSIS — N280 Ischemia and infarction of kidney: Secondary | ICD-10-CM

## 2013-06-15 MED ORDER — GADOBENATE DIMEGLUMINE 529 MG/ML IV SOLN
20.0000 mL | Freq: Once | INTRAVENOUS | Status: AC | PRN
  Administered 2013-06-15: 20 mL via INTRAVENOUS

## 2013-06-22 ENCOUNTER — Encounter (HOSPITAL_COMMUNITY): Payer: 59

## 2013-06-23 ENCOUNTER — Encounter (HOSPITAL_COMMUNITY)
Admission: RE | Admit: 2013-06-23 | Discharge: 2013-06-23 | Disposition: A | Discharge status: Home / Self Care | Payer: 59 | Source: Ambulatory Visit | Attending: Gastroenterology | Admitting: Gastroenterology

## 2013-06-23 DIAGNOSIS — Z5181 Encounter for therapeutic drug level monitoring: Secondary | ICD-10-CM | POA: Insufficient documentation

## 2013-06-23 DIAGNOSIS — K5090 Crohn's disease, unspecified, without complications: Secondary | ICD-10-CM | POA: Insufficient documentation

## 2013-06-23 MED ORDER — DIPHENHYDRAMINE HCL 50 MG/ML IJ SOLN
50.0000 mg | INTRAMUSCULAR | Status: DC
  Administered 2013-06-23: 50 mg via INTRAVENOUS

## 2013-06-23 MED ORDER — ACETAMINOPHEN 325 MG PO TABS
ORAL_TABLET | ORAL | Status: AC
  Filled 2013-06-23: qty 2

## 2013-06-23 MED ORDER — SODIUM CHLORIDE 0.9 % IV SOLN
INTRAVENOUS | Status: DC
  Administered 2013-06-23: 10:00:00 via INTRAVENOUS

## 2013-06-23 MED ORDER — ACETAMINOPHEN 325 MG PO TABS
650.0000 mg | ORAL_TABLET | ORAL | Status: DC
  Administered 2013-06-23: 650 mg via ORAL

## 2013-06-23 MED ORDER — SODIUM CHLORIDE 0.9 % IV SOLN
800.0000 mg | INTRAVENOUS | Status: DC
  Administered 2013-06-23: 800 mg via INTRAVENOUS
  Filled 2013-06-23: qty 80

## 2013-06-23 MED ORDER — DIPHENHYDRAMINE HCL 50 MG/ML IJ SOLN
INTRAMUSCULAR | Status: AC
  Filled 2013-06-23: qty 1

## 2013-08-18 ENCOUNTER — Encounter (HOSPITAL_COMMUNITY)
Admission: RE | Admit: 2013-08-18 | Discharge: 2013-08-18 | Disposition: A | Discharge status: Home / Self Care | Payer: 59 | Source: Ambulatory Visit | Attending: Gastroenterology | Admitting: Gastroenterology

## 2013-08-18 DIAGNOSIS — K5090 Crohn's disease, unspecified, without complications: Secondary | ICD-10-CM | POA: Insufficient documentation

## 2013-08-18 DIAGNOSIS — Z5181 Encounter for therapeutic drug level monitoring: Secondary | ICD-10-CM | POA: Diagnosis present

## 2013-08-18 MED ORDER — ACETAMINOPHEN 500 MG PO TABS
ORAL_TABLET | ORAL | Status: AC
  Administered 2013-08-18: 1000 mg via ORAL
  Filled 2013-08-18: qty 2

## 2013-08-18 MED ORDER — DIPHENHYDRAMINE HCL 50 MG/ML IJ SOLN
INTRAMUSCULAR | Status: AC
  Administered 2013-08-18: 50 mg via INTRAVENOUS
  Filled 2013-08-18: qty 1

## 2013-08-18 MED ORDER — DIPHENHYDRAMINE HCL 50 MG/ML IJ SOLN
50.0000 mg | INTRAMUSCULAR | Status: AC
  Administered 2013-08-18: 50 mg via INTRAVENOUS

## 2013-08-18 MED ORDER — SODIUM CHLORIDE 0.9 % IV SOLN
800.0000 mg | INTRAVENOUS | Status: AC
  Administered 2013-08-18: 800 mg via INTRAVENOUS
  Filled 2013-08-18: qty 80

## 2013-08-18 MED ORDER — ACETAMINOPHEN 500 MG PO TABS
1000.0000 mg | ORAL_TABLET | Freq: Once | ORAL | Status: AC
  Administered 2013-08-18: 1000 mg via ORAL

## 2013-08-18 MED ORDER — ACETAMINOPHEN 325 MG PO TABS: 650.0000 mg | ORAL_TABLET | ORAL | Status: DC

## 2013-08-18 MED ORDER — SODIUM CHLORIDE 0.9 % IV SOLN
INTRAVENOUS | Status: AC
  Administered 2013-08-18: 250 mL via INTRAVENOUS

## 2013-10-13 ENCOUNTER — Encounter (HOSPITAL_COMMUNITY): Payer: 59

## 2013-10-19 ENCOUNTER — Other Ambulatory Visit (HOSPITAL_COMMUNITY): Payer: Self-pay | Admitting: *Deleted

## 2013-10-20 ENCOUNTER — Encounter (HOSPITAL_COMMUNITY)
Admission: RE | Admit: 2013-10-20 | Discharge: 2013-10-20 | Disposition: A | Discharge status: Home / Self Care | Payer: 59 | Source: Ambulatory Visit | Attending: Gastroenterology | Admitting: Gastroenterology

## 2013-10-20 DIAGNOSIS — K5090 Crohn's disease, unspecified, without complications: Secondary | ICD-10-CM | POA: Insufficient documentation

## 2013-10-20 MED ORDER — DIPHENHYDRAMINE HCL 50 MG/ML IJ SOLN
50.0000 mg | Freq: Once | INTRAMUSCULAR | Status: DC
  Administered 2013-10-20: 50 mg via INTRAVENOUS

## 2013-10-20 MED ORDER — ACETAMINOPHEN 500 MG PO TABS
1000.0000 mg | ORAL_TABLET | ORAL | Status: DC
Start: 2013-10-20 — End: 2013-10-21
  Administered 2013-10-20: 1000 mg via ORAL

## 2013-10-20 MED ORDER — ACETAMINOPHEN 500 MG PO TABS
ORAL_TABLET | ORAL | Status: AC
  Filled 2013-10-20: qty 2

## 2013-10-20 MED ORDER — SODIUM CHLORIDE 0.9 % IV SOLN: INTRAVENOUS | Status: DC

## 2013-10-20 MED ORDER — INFLIXIMAB 100 MG IV SOLR
800.0000 mg | INTRAVENOUS | Status: DC
  Administered 2013-10-20: 800 mg via INTRAVENOUS
  Filled 2013-10-20: qty 80

## 2013-10-20 MED ORDER — DIPHENHYDRAMINE HCL 50 MG/ML IJ SOLN
INTRAMUSCULAR | Status: AC
  Filled 2013-10-20: qty 1

## 2013-12-15 ENCOUNTER — Encounter (HOSPITAL_COMMUNITY)
Admission: RE | Admit: 2013-12-15 | Discharge: 2013-12-15 | Disposition: A | Discharge status: Home / Self Care | Payer: 59 | Source: Ambulatory Visit | Attending: Gastroenterology | Admitting: Gastroenterology

## 2013-12-15 DIAGNOSIS — K5090 Crohn's disease, unspecified, without complications: Secondary | ICD-10-CM | POA: Insufficient documentation

## 2013-12-15 MED ORDER — ACETAMINOPHEN 500 MG PO TABS
1000.0000 mg | ORAL_TABLET | ORAL | Status: DC
  Administered 2013-12-15: 1000 mg via ORAL

## 2013-12-15 MED ORDER — SODIUM CHLORIDE 0.9 % IV SOLN
INTRAVENOUS | Status: DC
Start: 2013-12-15 — End: 2013-12-16
  Administered 2013-12-15: 11:00:00 via INTRAVENOUS

## 2013-12-15 MED ORDER — SODIUM CHLORIDE 0.9 % IV SOLN
800.0000 mg | INTRAVENOUS | Status: DC
  Administered 2013-12-15: 800 mg via INTRAVENOUS
  Filled 2013-12-15: qty 80

## 2013-12-15 MED ORDER — DIPHENHYDRAMINE HCL 50 MG/ML IJ SOLN
50.0000 mg | Freq: Once | INTRAMUSCULAR | Status: AC
  Administered 2013-12-15: 50 mg via INTRAVENOUS

## 2014-02-08 ENCOUNTER — Encounter (HOSPITAL_COMMUNITY)
Admission: RE | Admit: 2014-02-08 | Discharge: 2014-02-08 | Disposition: A | Discharge status: Home / Self Care | Payer: 59 | Source: Ambulatory Visit | Attending: Gastroenterology | Admitting: Gastroenterology

## 2014-02-08 DIAGNOSIS — K5090 Crohn's disease, unspecified, without complications: Secondary | ICD-10-CM | POA: Insufficient documentation

## 2014-02-08 MED ORDER — SODIUM CHLORIDE 0.9 % IV SOLN
INTRAVENOUS | Status: DC
Start: 2014-02-08 — End: 2014-02-09
  Administered 2014-02-08: 250 mL via INTRAVENOUS
  Administered 2014-02-08: 11:00:00 via INTRAVENOUS

## 2014-02-08 MED ORDER — ACETAMINOPHEN 500 MG PO TABS
ORAL_TABLET | ORAL | Status: AC
  Administered 2014-02-08: 1000 mg
  Filled 2014-02-08: qty 2

## 2014-02-08 MED ORDER — INFLIXIMAB 100 MG IV SOLR
800.0000 mg | INTRAVENOUS | Status: DC
  Administered 2014-02-08: 800 mg via INTRAVENOUS
  Filled 2014-02-08: qty 80

## 2014-02-08 MED ORDER — ACETAMINOPHEN 500 MG PO TABS: 1000.0000 mg | ORAL_TABLET | ORAL | Status: DC

## 2014-02-08 MED ORDER — DIPHENHYDRAMINE HCL 50 MG/ML IJ SOLN: 50.0000 mg | Freq: Once | INTRAMUSCULAR | Status: DC

## 2014-02-08 MED ORDER — DIPHENHYDRAMINE HCL 50 MG/ML IJ SOLN
INTRAMUSCULAR | Status: AC
  Administered 2014-02-08: 50 mg
  Filled 2014-02-08: qty 1

## 2014-02-09 ENCOUNTER — Encounter (HOSPITAL_COMMUNITY): Payer: 59

## 2014-02-09 DIAGNOSIS — K5090 Crohn's disease, unspecified, without complications: Secondary | ICD-10-CM | POA: Diagnosis not present

## 2014-04-13 ENCOUNTER — Encounter (HOSPITAL_COMMUNITY)
Admission: RE | Admit: 2014-04-13 | Discharge: 2014-04-13 | Disposition: A | Discharge status: Home / Self Care | Payer: 59 | Source: Ambulatory Visit | Attending: Gastroenterology | Admitting: Gastroenterology

## 2014-04-13 DIAGNOSIS — K5090 Crohn's disease, unspecified, without complications: Secondary | ICD-10-CM | POA: Insufficient documentation

## 2014-04-13 MED ORDER — SODIUM CHLORIDE 0.9 % IV SOLN
800.0000 mg | INTRAVENOUS | Status: DC
  Administered 2014-04-13: 800 mg via INTRAVENOUS
  Filled 2014-04-13: qty 80

## 2014-04-13 MED ORDER — DIPHENHYDRAMINE HCL 50 MG/ML IJ SOLN: 50.0000 mg | INTRAMUSCULAR | Status: DC

## 2014-04-13 MED ORDER — SODIUM CHLORIDE 0.9 % IV SOLN
INTRAVENOUS | Status: DC
  Administered 2014-04-13: 10:00:00 via INTRAVENOUS

## 2014-04-13 MED ORDER — ACETAMINOPHEN 500 MG PO TABS
1000.0000 mg | ORAL_TABLET | ORAL | Status: DC
  Administered 2014-04-13: 1000 mg via ORAL

## 2014-04-13 MED ORDER — DIPHENHYDRAMINE HCL 50 MG/ML IJ SOLN
INTRAMUSCULAR | Status: AC
  Administered 2014-04-13: 50 mg
  Filled 2014-04-13: qty 1

## 2014-04-13 MED ORDER — ACETAMINOPHEN 500 MG PO TABS
ORAL_TABLET | ORAL | Status: AC
Start: 2014-04-13 — End: 2014-04-13
  Filled 2014-04-13: qty 2

## 2014-06-07 ENCOUNTER — Other Ambulatory Visit (HOSPITAL_COMMUNITY): Payer: Self-pay | Admitting: *Deleted

## 2014-06-08 ENCOUNTER — Encounter (HOSPITAL_COMMUNITY)
Admission: RE | Admit: 2014-06-08 | Discharge: 2014-06-08 | Disposition: A | Discharge status: Home / Self Care | Payer: 59 | Source: Ambulatory Visit | Attending: Gastroenterology | Admitting: Gastroenterology

## 2014-06-08 DIAGNOSIS — K5090 Crohn's disease, unspecified, without complications: Secondary | ICD-10-CM | POA: Insufficient documentation

## 2014-06-08 MED ORDER — SODIUM CHLORIDE 0.9 % IV SOLN
INTRAVENOUS | Status: DC
Start: 2014-06-08 — End: 2014-06-09
  Administered 2014-06-08: 11:00:00 via INTRAVENOUS

## 2014-06-08 MED ORDER — ACETAMINOPHEN 500 MG PO TABS
1000.0000 mg | ORAL_TABLET | ORAL | Status: DC
  Administered 2014-06-08: 1000 mg via ORAL

## 2014-06-08 MED ORDER — DIPHENHYDRAMINE HCL 50 MG/ML IJ SOLN
50.0000 mg | INTRAMUSCULAR | Status: DC
Start: 2014-06-08 — End: 2014-06-09
  Administered 2014-06-08: 50 mg via INTRAVENOUS

## 2014-06-08 MED ORDER — DIPHENHYDRAMINE HCL 50 MG/ML IJ SOLN
INTRAMUSCULAR | Status: AC
  Filled 2014-06-08: qty 1

## 2014-06-08 MED ORDER — INFLIXIMAB 100 MG IV SOLR
800.0000 mg | INTRAVENOUS | Status: DC
  Administered 2014-06-08: 800 mg via INTRAVENOUS
  Filled 2014-06-08: qty 80

## 2014-06-08 MED ORDER — ACETAMINOPHEN 500 MG PO TABS
ORAL_TABLET | ORAL | Status: AC
  Filled 2014-06-08: qty 2

## 2014-08-02 ENCOUNTER — Other Ambulatory Visit (HOSPITAL_COMMUNITY): Payer: Self-pay

## 2014-08-03 ENCOUNTER — Encounter (HOSPITAL_COMMUNITY)
Admission: RE | Admit: 2014-08-03 | Discharge: 2014-08-03 | Disposition: A | Discharge status: Home / Self Care | Payer: 59 | Source: Ambulatory Visit | Attending: Gastroenterology | Admitting: Gastroenterology

## 2014-08-03 DIAGNOSIS — K5090 Crohn's disease, unspecified, without complications: Secondary | ICD-10-CM | POA: Insufficient documentation

## 2014-08-09 ENCOUNTER — Encounter (HOSPITAL_COMMUNITY)
Admission: RE | Admit: 2014-08-09 | Discharge: 2014-08-09 | Disposition: A | Discharge status: Home / Self Care | Payer: 59 | Source: Ambulatory Visit | Attending: Gastroenterology | Admitting: Gastroenterology

## 2014-08-09 DIAGNOSIS — K5090 Crohn's disease, unspecified, without complications: Secondary | ICD-10-CM | POA: Diagnosis not present

## 2014-08-09 MED ORDER — ACETAMINOPHEN 500 MG PO TABS
ORAL_TABLET | ORAL | Status: AC
  Filled 2014-08-09: qty 2

## 2014-08-09 MED ORDER — DIPHENHYDRAMINE HCL 50 MG/ML IJ SOLN
50.0000 mg | Freq: Once | INTRAMUSCULAR | Status: AC
  Administered 2014-08-09: 50 mg via INTRAVENOUS

## 2014-08-09 MED ORDER — SODIUM CHLORIDE 0.9 % IV SOLN
INTRAVENOUS | Status: DC
  Administered 2014-08-09: 11:00:00 via INTRAVENOUS

## 2014-08-09 MED ORDER — SODIUM CHLORIDE 0.9 % IV SOLN
800.0000 mg | INTRAVENOUS | Status: DC
Start: 2014-08-09 — End: 2014-08-10
  Administered 2014-08-09: 800 mg via INTRAVENOUS
  Filled 2014-08-09: qty 80

## 2014-08-09 MED ORDER — ACETAMINOPHEN 500 MG PO TABS
1000.0000 mg | ORAL_TABLET | Freq: Once | ORAL | Status: AC
  Administered 2014-08-09: 1000 mg via ORAL

## 2014-08-09 MED ORDER — DIPHENHYDRAMINE HCL 50 MG/ML IJ SOLN
INTRAMUSCULAR | Status: AC
  Filled 2014-08-09: qty 1

## 2014-08-09 MED ORDER — DIPHENHYDRAMINE HCL 25 MG PO CAPS: 50.0000 mg | ORAL_CAPSULE | Freq: Once | ORAL | Status: DC

## 2014-10-04 ENCOUNTER — Inpatient Hospital Stay (HOSPITAL_COMMUNITY): Admission: RE | Admit: 2014-10-04 | Payer: 59 | Source: Ambulatory Visit

## 2014-10-12 ENCOUNTER — Encounter (HOSPITAL_COMMUNITY)
Admission: RE | Admit: 2014-10-12 | Discharge: 2014-10-12 | Disposition: A | Discharge status: Home / Self Care | Payer: 59 | Source: Ambulatory Visit | Attending: Gastroenterology | Admitting: Gastroenterology

## 2014-10-12 DIAGNOSIS — K5090 Crohn's disease, unspecified, without complications: Secondary | ICD-10-CM | POA: Diagnosis not present

## 2014-10-12 MED ORDER — ACETAMINOPHEN 325 MG PO TABS: 650.0000 mg | ORAL_TABLET | Freq: Once | ORAL | Status: DC

## 2014-10-12 MED ORDER — SODIUM CHLORIDE 0.9 % IV SOLN
Freq: Once | INTRAVENOUS | Status: AC
  Administered 2014-10-12: 11:00:00 via INTRAVENOUS

## 2014-10-12 MED ORDER — DIPHENHYDRAMINE HCL 50 MG/ML IJ SOLN
50.0000 mg | Freq: Once | INTRAMUSCULAR | Status: AC
  Administered 2014-10-12: 50 mg via INTRAVENOUS

## 2014-10-12 MED ORDER — DIPHENHYDRAMINE HCL 50 MG/ML IJ SOLN
INTRAMUSCULAR | Status: AC
  Filled 2014-10-12: qty 1

## 2014-10-12 MED ORDER — SODIUM CHLORIDE 0.9 % IV SOLN
800.0000 mg | Freq: Once | INTRAVENOUS | Status: AC
  Administered 2014-10-12: 800 mg via INTRAVENOUS
  Filled 2014-10-12: qty 80

## 2014-11-15 ENCOUNTER — Other Ambulatory Visit: Payer: Self-pay | Admitting: Family Medicine

## 2014-11-15 DIAGNOSIS — M542 Cervicalgia: Secondary | ICD-10-CM

## 2014-11-23 ENCOUNTER — Ambulatory Visit
Admission: RE | Admit: 2014-11-23 | Discharge: 2014-11-23 | Disposition: A | Discharge status: Home / Self Care | Payer: 59 | Source: Ambulatory Visit | Attending: Family Medicine | Admitting: Family Medicine

## 2014-11-23 ENCOUNTER — Encounter (INDEPENDENT_AMBULATORY_CARE_PROVIDER_SITE_OTHER): Payer: Self-pay

## 2014-11-23 ENCOUNTER — Other Ambulatory Visit: Payer: 59

## 2014-11-23 DIAGNOSIS — M542 Cervicalgia: Secondary | ICD-10-CM

## 2014-11-28 ENCOUNTER — Other Ambulatory Visit: Payer: 59

## 2014-12-10 ENCOUNTER — Encounter (HOSPITAL_COMMUNITY)
Admission: RE | Admit: 2014-12-10 | Discharge: 2014-12-10 | Disposition: A | Discharge status: Home / Self Care | Payer: 59 | Source: Ambulatory Visit | Attending: Gastroenterology | Admitting: Gastroenterology

## 2014-12-10 DIAGNOSIS — K5090 Crohn's disease, unspecified, without complications: Secondary | ICD-10-CM | POA: Insufficient documentation

## 2014-12-10 MED ORDER — ACETAMINOPHEN 500 MG PO TABS
ORAL_TABLET | ORAL | Status: AC
  Administered 2014-12-10: 1000 mg via ORAL
  Filled 2014-12-10: qty 2

## 2014-12-10 MED ORDER — ACETAMINOPHEN 500 MG PO TABS
1000.0000 mg | ORAL_TABLET | ORAL | Status: DC
  Administered 2014-12-10: 1000 mg via ORAL

## 2014-12-10 MED ORDER — DIPHENHYDRAMINE HCL 50 MG/ML IJ SOLN
50.0000 mg | INTRAMUSCULAR | Status: DC
  Administered 2014-12-10: 50 mg via INTRAVENOUS

## 2014-12-10 MED ORDER — DIPHENHYDRAMINE HCL 50 MG/ML IJ SOLN
INTRAMUSCULAR | Status: AC
  Administered 2014-12-10: 50 mg via INTRAVENOUS
  Filled 2014-12-10: qty 1

## 2014-12-10 MED ORDER — SODIUM CHLORIDE 0.9 % IV SOLN
800.0000 mg | INTRAVENOUS | Status: DC
  Administered 2014-12-10: 800 mg via INTRAVENOUS
  Filled 2014-12-10: qty 80

## 2014-12-10 MED ORDER — SODIUM CHLORIDE 0.9 % IV SOLN
INTRAVENOUS | Status: DC
  Administered 2014-12-10: 250 mL via INTRAVENOUS

## 2015-02-04 ENCOUNTER — Encounter (HOSPITAL_COMMUNITY)
Admission: RE | Admit: 2015-02-04 | Discharge: 2015-02-04 | Disposition: A | Discharge status: Home / Self Care | Payer: 59 | Source: Ambulatory Visit | Attending: Gastroenterology | Admitting: Gastroenterology

## 2015-02-04 DIAGNOSIS — K5090 Crohn's disease, unspecified, without complications: Secondary | ICD-10-CM | POA: Diagnosis not present

## 2015-02-04 MED ORDER — SODIUM CHLORIDE 0.9 % IV SOLN
INTRAVENOUS | Status: DC
  Administered 2015-02-04: 10:00:00 via INTRAVENOUS

## 2015-02-04 MED ORDER — DIPHENHYDRAMINE HCL 50 MG/ML IJ SOLN
50.0000 mg | INTRAMUSCULAR | Status: DC
  Administered 2015-02-04: 50 mg via INTRAVENOUS

## 2015-02-04 MED ORDER — ACETAMINOPHEN 325 MG PO TABS
ORAL_TABLET | ORAL | Status: AC
  Filled 2015-02-04: qty 2

## 2015-02-04 MED ORDER — ACETAMINOPHEN 500 MG PO TABS
1000.0000 mg | ORAL_TABLET | ORAL | Status: DC
  Administered 2015-02-04: 1000 mg via ORAL

## 2015-02-04 MED ORDER — SODIUM CHLORIDE 0.9 % IV SOLN
800.0000 mg | INTRAVENOUS | Status: DC
  Administered 2015-02-04: 800 mg via INTRAVENOUS
  Filled 2015-02-04: qty 80

## 2015-02-04 MED ORDER — DIPHENHYDRAMINE HCL 50 MG/ML IJ SOLN
INTRAMUSCULAR | Status: AC
  Filled 2015-02-04: qty 1

## 2015-04-01 ENCOUNTER — Encounter (HOSPITAL_COMMUNITY): Payer: 59

## 2015-04-09 ENCOUNTER — Ambulatory Visit (HOSPITAL_COMMUNITY)
Admission: RE | Admit: 2015-04-09 | Discharge: 2015-04-09 | Disposition: A | Discharge status: Home / Self Care | Payer: 59 | Source: Ambulatory Visit | Attending: Gastroenterology | Admitting: Gastroenterology

## 2015-04-09 DIAGNOSIS — K5090 Crohn's disease, unspecified, without complications: Secondary | ICD-10-CM | POA: Insufficient documentation

## 2015-04-09 MED ORDER — DIPHENHYDRAMINE HCL 50 MG/ML IJ SOLN
50.0000 mg | INTRAMUSCULAR | Status: AC
  Administered 2015-04-09: 50 mg via INTRAVENOUS

## 2015-04-09 MED ORDER — ACETAMINOPHEN 500 MG PO TABS
ORAL_TABLET | ORAL | Status: AC
  Filled 2015-04-09: qty 2

## 2015-04-09 MED ORDER — DIPHENHYDRAMINE HCL 50 MG/ML IJ SOLN
INTRAMUSCULAR | Status: AC
  Filled 2015-04-09: qty 1

## 2015-04-09 MED ORDER — SODIUM CHLORIDE 0.9 % IV SOLN
INTRAVENOUS | Status: AC
  Administered 2015-04-09: 11:00:00 via INTRAVENOUS

## 2015-04-09 MED ORDER — SODIUM CHLORIDE 0.9 % IV SOLN
800.0000 mg | INTRAVENOUS | Status: AC
  Administered 2015-04-09: 800 mg via INTRAVENOUS
  Filled 2015-04-09: qty 80

## 2015-04-09 MED ORDER — ACETAMINOPHEN 500 MG PO TABS
1000.0000 mg | ORAL_TABLET | ORAL | Status: AC
  Administered 2015-04-09: 1000 mg via ORAL

## 2015-06-04 ENCOUNTER — Encounter (HOSPITAL_COMMUNITY): Payer: 59

## 2015-06-11 ENCOUNTER — Other Ambulatory Visit (HOSPITAL_COMMUNITY): Payer: Self-pay | Admitting: *Deleted

## 2015-06-12 ENCOUNTER — Ambulatory Visit (HOSPITAL_COMMUNITY)
Admission: RE | Admit: 2015-06-12 | Discharge: 2015-06-12 | Disposition: A | Discharge status: Home / Self Care | Payer: 59 | Source: Ambulatory Visit | Attending: Gastroenterology | Admitting: Gastroenterology

## 2015-06-12 DIAGNOSIS — K5090 Crohn's disease, unspecified, without complications: Secondary | ICD-10-CM | POA: Insufficient documentation

## 2015-06-12 MED ORDER — DIPHENHYDRAMINE HCL 50 MG/ML IJ SOLN
INTRAMUSCULAR | Status: AC
  Filled 2015-06-12: qty 1

## 2015-06-12 MED ORDER — ACETAMINOPHEN 500 MG PO TABS
1000.0000 mg | ORAL_TABLET | ORAL | Status: AC
  Administered 2015-06-12: 1000 mg via ORAL

## 2015-06-12 MED ORDER — ACETAMINOPHEN 500 MG PO TABS
ORAL_TABLET | ORAL | Status: AC
  Filled 2015-06-12: qty 2

## 2015-06-12 MED ORDER — DIPHENHYDRAMINE HCL 50 MG/ML IJ SOLN
50.0000 mg | INTRAMUSCULAR | Status: AC
  Administered 2015-06-12: 50 mg via INTRAVENOUS

## 2015-06-12 MED ORDER — SODIUM CHLORIDE 0.9 % IV SOLN
INTRAVENOUS | Status: AC
  Administered 2015-06-12: 11:00:00 via INTRAVENOUS

## 2015-06-12 MED ORDER — SODIUM CHLORIDE 0.9 % IV SOLN
800.0000 mg | INTRAVENOUS | Status: AC
  Administered 2015-06-12: 800 mg via INTRAVENOUS
  Filled 2015-06-12: qty 80

## 2015-06-20 ENCOUNTER — Other Ambulatory Visit: Payer: Self-pay | Admitting: Gastroenterology

## 2015-08-07 ENCOUNTER — Ambulatory Visit (HOSPITAL_COMMUNITY)
Admission: RE | Admit: 2015-08-07 | Discharge: 2015-08-07 | Disposition: A | Discharge status: Home / Self Care | Payer: 59 | Source: Ambulatory Visit | Attending: Gastroenterology | Admitting: Gastroenterology

## 2015-08-07 DIAGNOSIS — K5090 Crohn's disease, unspecified, without complications: Secondary | ICD-10-CM | POA: Diagnosis not present

## 2015-08-07 MED ORDER — DIPHENHYDRAMINE HCL 50 MG/ML IJ SOLN
50.0000 mg | INTRAMUSCULAR | Status: AC
  Administered 2015-08-07: 50 mg via INTRAVENOUS

## 2015-08-07 MED ORDER — SODIUM CHLORIDE 0.9 % IV SOLN
INTRAVENOUS | Status: AC
  Administered 2015-08-07: 10:00:00 via INTRAVENOUS

## 2015-08-07 MED ORDER — ACETAMINOPHEN 500 MG PO TABS
1000.0000 mg | ORAL_TABLET | ORAL | Status: AC
  Administered 2015-08-07: 1000 mg via ORAL

## 2015-08-07 MED ORDER — DIPHENHYDRAMINE HCL 50 MG/ML IJ SOLN
INTRAMUSCULAR | Status: AC
  Filled 2015-08-07: qty 1

## 2015-08-07 MED ORDER — ACETAMINOPHEN 500 MG PO TABS
ORAL_TABLET | ORAL | Status: AC
  Filled 2015-08-07: qty 2

## 2015-08-07 MED ORDER — ACETAMINOPHEN 325 MG PO TABS
ORAL_TABLET | ORAL | Status: AC
  Filled 2015-08-07: qty 2

## 2015-08-07 MED ORDER — SODIUM CHLORIDE 0.9 % IV SOLN
800.0000 mg | INTRAVENOUS | Status: AC
  Administered 2015-08-07: 800 mg via INTRAVENOUS
  Filled 2015-08-07: qty 80

## 2015-10-01 ENCOUNTER — Other Ambulatory Visit (HOSPITAL_COMMUNITY): Payer: Self-pay | Admitting: *Deleted

## 2015-10-02 ENCOUNTER — Ambulatory Visit (HOSPITAL_COMMUNITY)
Admission: RE | Admit: 2015-10-02 | Discharge: 2015-10-02 | Disposition: A | Discharge status: Home / Self Care | Payer: 59 | Source: Ambulatory Visit | Attending: Gastroenterology | Admitting: Gastroenterology

## 2015-10-02 DIAGNOSIS — K5090 Crohn's disease, unspecified, without complications: Secondary | ICD-10-CM | POA: Insufficient documentation

## 2015-10-02 MED ORDER — INFLIXIMAB 100 MG IV SOLR
800.0000 mg | Freq: Once | INTRAVENOUS | Status: AC
  Administered 2015-10-02: 800 mg via INTRAVENOUS
  Filled 2015-10-02: qty 80

## 2015-10-02 MED ORDER — ACETAMINOPHEN 500 MG PO TABS
1000.0000 mg | ORAL_TABLET | Freq: Once | ORAL | Status: AC
Start: 2015-10-02 — End: 2015-10-02
  Administered 2015-10-02: 1000 mg via ORAL

## 2015-10-02 MED ORDER — SODIUM CHLORIDE 0.9 % IV SOLN
Freq: Once | INTRAVENOUS | Status: AC
  Administered 2015-10-02: 10:00:00 via INTRAVENOUS

## 2015-10-02 MED ORDER — DIPHENHYDRAMINE HCL 50 MG/ML IJ SOLN
50.0000 mg | Freq: Once | INTRAMUSCULAR | Status: AC
  Administered 2015-10-02: 50 mg via INTRAVENOUS

## 2015-10-02 MED ORDER — ACETAMINOPHEN 500 MG PO TABS
ORAL_TABLET | ORAL | Status: AC
  Filled 2015-10-02: qty 2

## 2015-10-02 MED ORDER — DIPHENHYDRAMINE HCL 50 MG/ML IJ SOLN
INTRAMUSCULAR | Status: AC
  Filled 2015-10-02: qty 1

## 2015-11-26 ENCOUNTER — Other Ambulatory Visit (HOSPITAL_COMMUNITY): Payer: Self-pay | Admitting: *Deleted

## 2015-11-27 ENCOUNTER — Ambulatory Visit (HOSPITAL_COMMUNITY): Admission: RE | Admit: 2015-11-27 | Payer: 59 | Source: Ambulatory Visit

## 2015-12-09 ENCOUNTER — Ambulatory Visit (HOSPITAL_COMMUNITY)
Admission: RE | Admit: 2015-12-09 | Discharge: 2015-12-09 | Disposition: A | Discharge status: Home / Self Care | Payer: 59 | Source: Ambulatory Visit | Attending: Gastroenterology | Admitting: Gastroenterology

## 2015-12-09 DIAGNOSIS — K5090 Crohn's disease, unspecified, without complications: Secondary | ICD-10-CM | POA: Diagnosis not present

## 2015-12-09 MED ORDER — SODIUM CHLORIDE 0.9 % IV SOLN
INTRAVENOUS | Status: DC
  Administered 2015-12-09: 10:00:00 via INTRAVENOUS

## 2015-12-09 MED ORDER — DIPHENHYDRAMINE HCL 50 MG/ML IJ SOLN
50.0000 mg | INTRAMUSCULAR | Status: DC
  Administered 2015-12-09: 50 mg via INTRAVENOUS

## 2015-12-09 MED ORDER — ACETAMINOPHEN 500 MG PO TABS
ORAL_TABLET | ORAL | Status: AC
  Filled 2015-12-09: qty 2

## 2015-12-09 MED ORDER — DIPHENHYDRAMINE HCL 50 MG/ML IJ SOLN
INTRAMUSCULAR | Status: AC
  Filled 2015-12-09: qty 1

## 2015-12-09 MED ORDER — ACETAMINOPHEN 500 MG PO TABS
1000.0000 mg | ORAL_TABLET | ORAL | Status: DC
  Administered 2015-12-09: 1000 mg via ORAL

## 2015-12-09 MED ORDER — SODIUM CHLORIDE 0.9 % IV SOLN
800.0000 mg | INTRAVENOUS | Status: DC
  Administered 2015-12-09: 800 mg via INTRAVENOUS
  Filled 2015-12-09 (×2): qty 80

## 2016-01-31 ENCOUNTER — Other Ambulatory Visit (HOSPITAL_COMMUNITY): Payer: Self-pay | Admitting: *Deleted

## 2016-02-03 ENCOUNTER — Ambulatory Visit (HOSPITAL_COMMUNITY)
Admission: RE | Admit: 2016-02-03 | Discharge: 2016-02-03 | Disposition: A | Discharge status: Home / Self Care | Payer: 59 | Source: Ambulatory Visit | Attending: Gastroenterology | Admitting: Gastroenterology

## 2016-02-03 DIAGNOSIS — K5090 Crohn's disease, unspecified, without complications: Secondary | ICD-10-CM | POA: Insufficient documentation

## 2016-02-03 MED ORDER — ACETAMINOPHEN 500 MG PO TABS
1000.0000 mg | ORAL_TABLET | ORAL | Status: DC
  Administered 2016-02-03: 1000 mg via ORAL

## 2016-02-03 MED ORDER — DIPHENHYDRAMINE HCL 50 MG/ML IJ SOLN
INTRAMUSCULAR | Status: AC
  Administered 2016-02-03: 50 mg via INTRAVENOUS
  Filled 2016-02-03: qty 1

## 2016-02-03 MED ORDER — DIPHENHYDRAMINE HCL 50 MG/ML IJ SOLN
50.0000 mg | INTRAMUSCULAR | Status: DC
  Administered 2016-02-03: 50 mg via INTRAVENOUS

## 2016-02-03 MED ORDER — INFLIXIMAB 100 MG IV SOLR
800.0000 mg | INTRAVENOUS | Status: DC
  Administered 2016-02-03: 800 mg via INTRAVENOUS
  Filled 2016-02-03: qty 80

## 2016-02-03 MED ORDER — SODIUM CHLORIDE 0.9 % IV SOLN
INTRAVENOUS | Status: DC
  Administered 2016-02-03: 11:00:00 via INTRAVENOUS

## 2016-02-03 MED ORDER — ACETAMINOPHEN 500 MG PO TABS
ORAL_TABLET | ORAL | Status: AC
  Administered 2016-02-03: 1000 mg via ORAL
  Filled 2016-02-03: qty 2

## 2016-02-08 IMAGING — US US CAROTID DUPLEX BILAT
1 series · 13 of 24 positions shown · non-contrast
Comparison: None

CLINICAL DATA: 54-year-old male with a history of right neck pain.

Cardiovascular risk factors are none.
EXAM:
BILATERAL CAROTID DUPLEX ULTRASOUND
TECHNIQUE: Gray scale imaging, color Doppler and duplex ultrasound were
performed of bilateral carotid and vertebral arteries in the neck.

[Series 1: us carotid duplex bilat · 0.07mm/px · 13 of 54 slices shown]
[im 1/54]
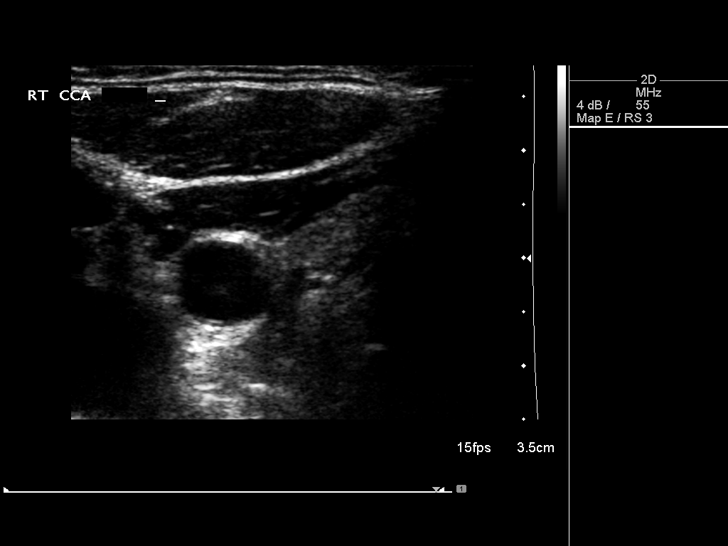
[im 5/54]
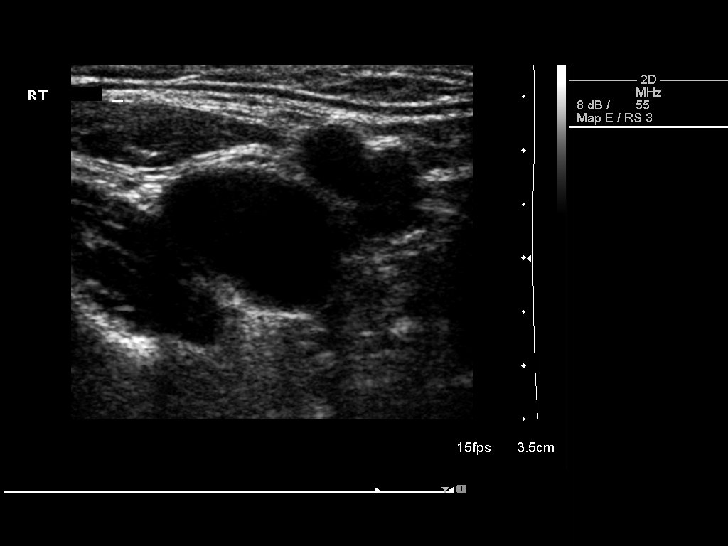
[im 10/54]
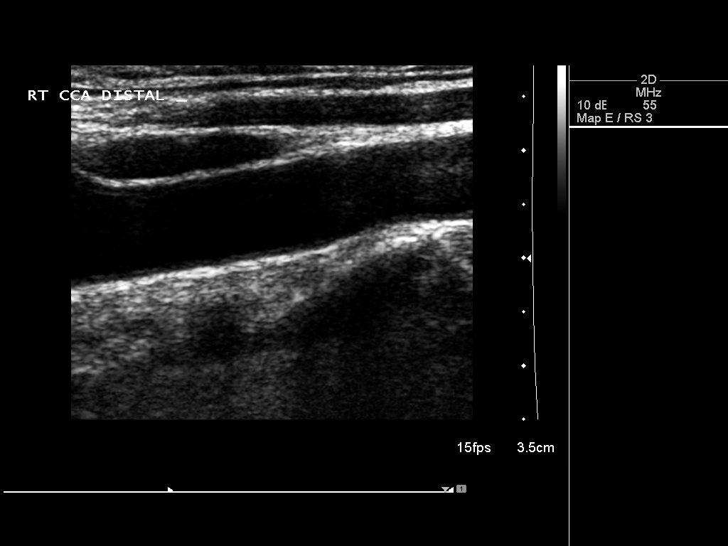
[im 14/54]
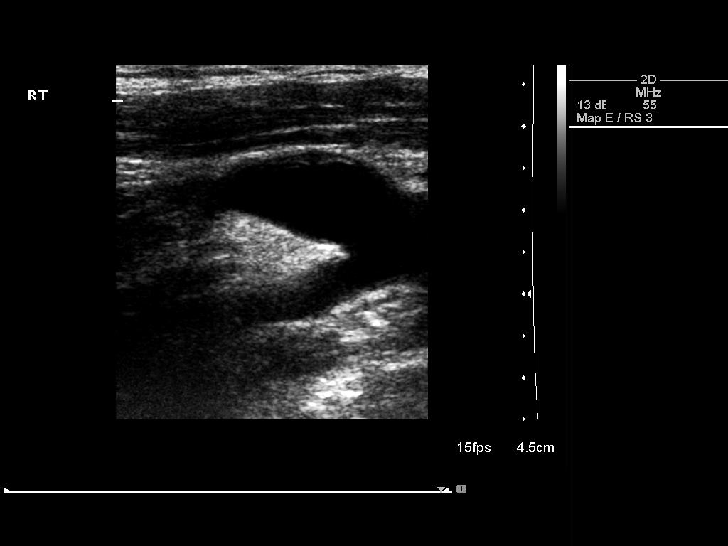
[im 19/54]
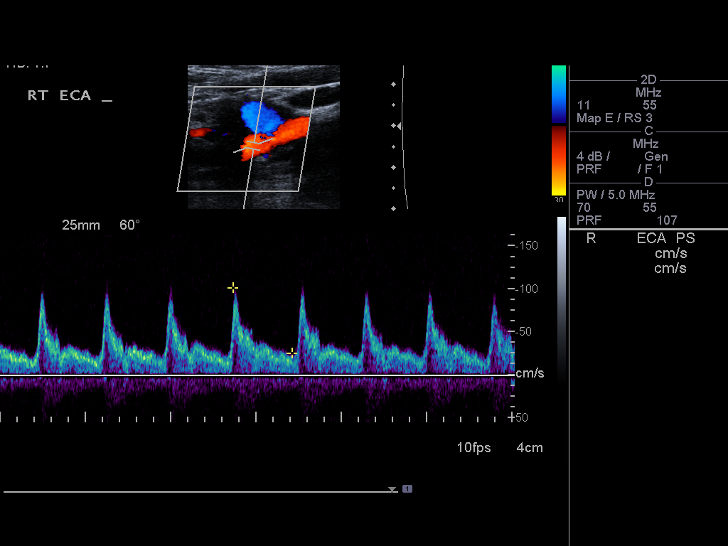
[im 24/54]
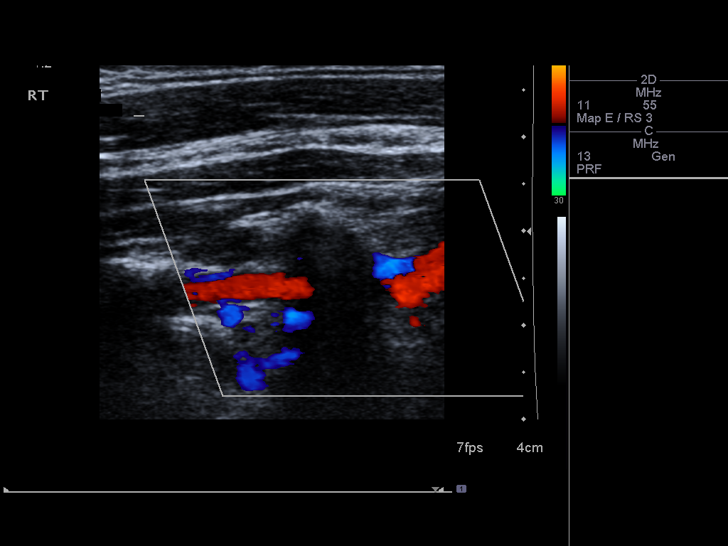
[im 28/54]
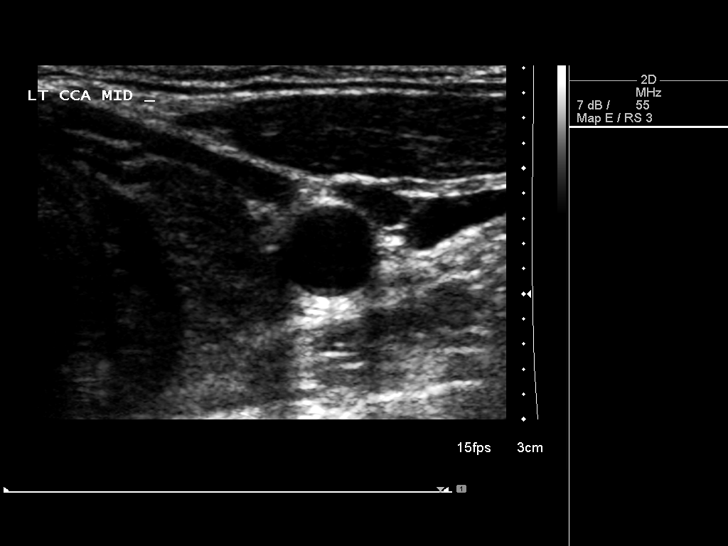
[im 30/54]
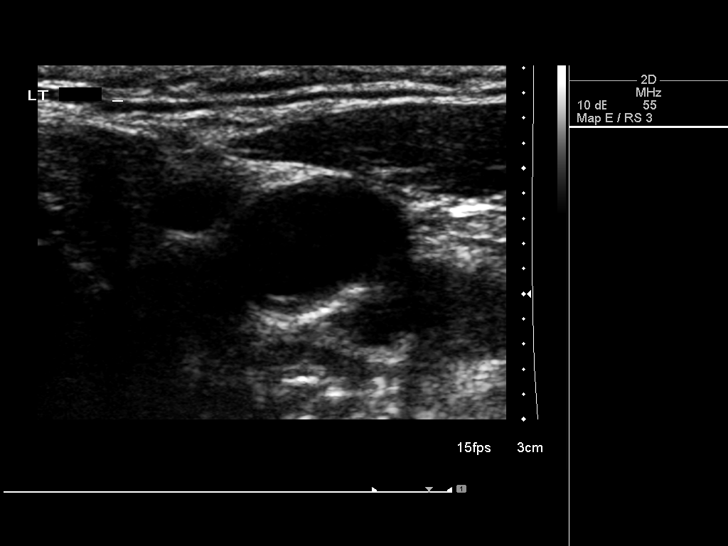
[im 35/54]
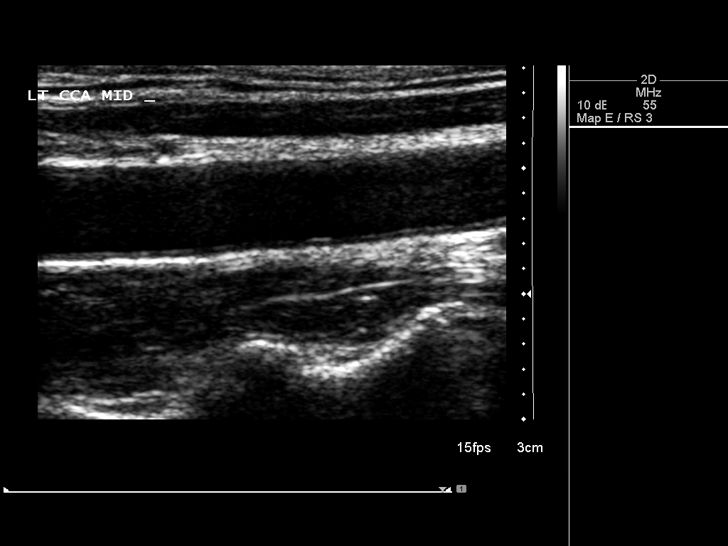
[im 40/54]
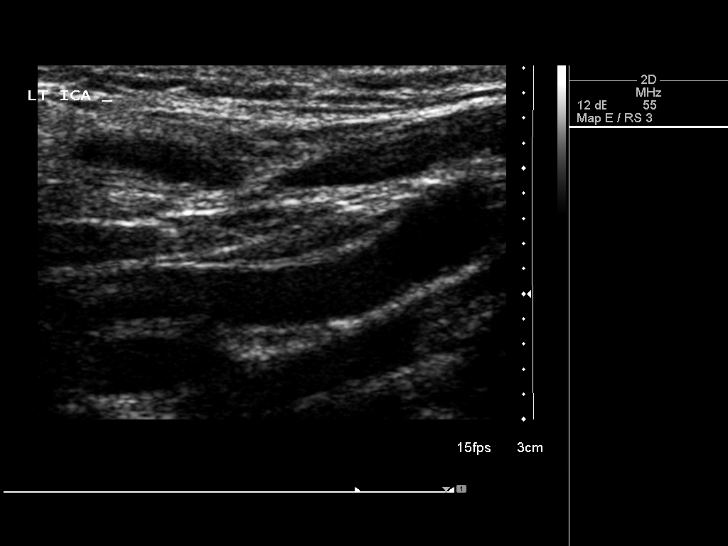
[im 44/54]
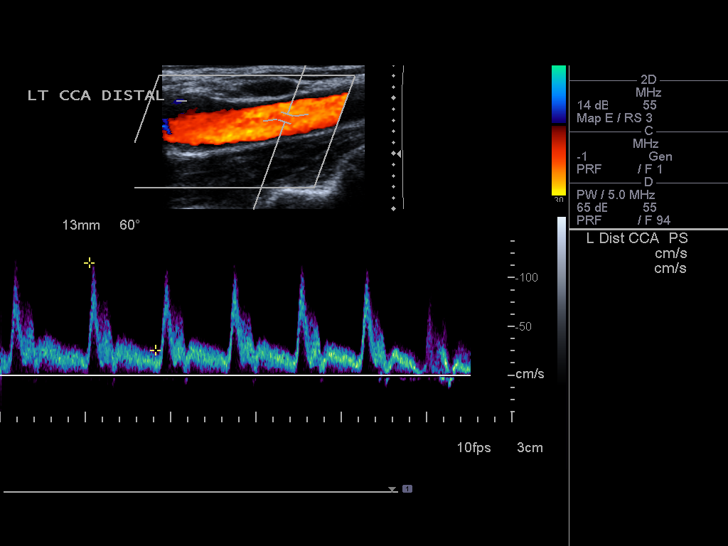
[im 49/54]
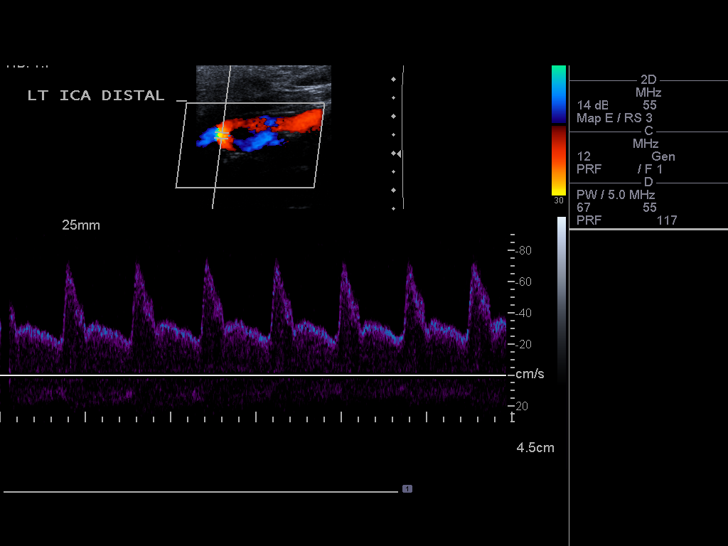
[im 54/54]
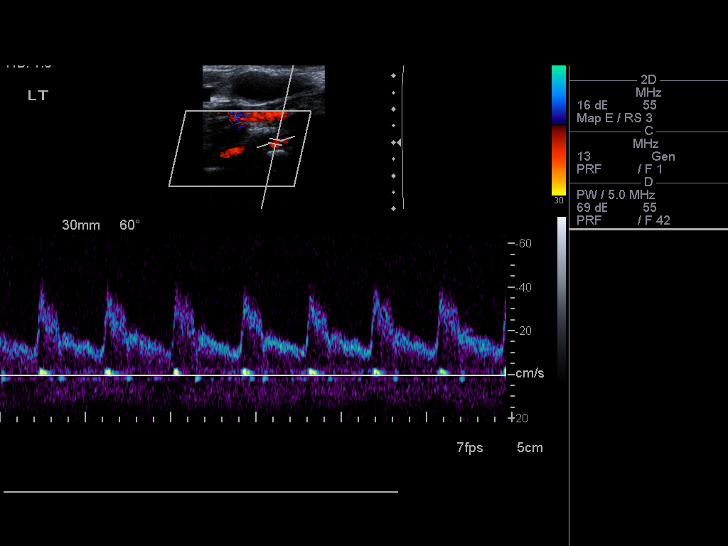

[13 of 24 positions shown; findings below may reference images not displayed]

FINDINGS: Criteria: Quantification of carotid stenosis is based on velocity
parameters that correlate the residual internal carotid diameter
with NASCET-based stenosis levels, using the diameter of the distal
internal carotid lumen as the denominator for stenosis measurement.

The following velocity measurements were obtained:

RIGHT

ICA:  Systolic 100 cm/sec, Diastolic 26 cm/sec

CCA:  140 cm/sec

SYSTOLIC ICA/CCA RATIO:

ECA:  102 cm/sec

LEFT

ICA:  Systolic 115 cm/sec, Diastolic 25 cm/sec

CCA:  115 cm/sec

SYSTOLIC ICA/CCA RATIO:

ECA:  103 cm/sec

Right Brachial SBP: 133

Left Brachial SBP: 134

RIGHT CAROTID ARTERY: No significant calcified disease of the right
common carotid artery. Intermediate waveform maintained.
Heterogeneous plaque without significant calcifications at the right
carotid bifurcation. Low resistance waveform of the right ICA.
Tortuosity.

RIGHT VERTEBRAL ARTERY: Antegrade flow with low resistance waveform.

LEFT CAROTID ARTERY: No significant calcified disease of the left
common carotid artery. Intermediate waveform maintained.
Heterogeneous plaque at the left carotid bifurcation without
significant calcifications. Low resistance waveform of the left ICA.
Tortuosity.

LEFT VERTEBRAL ARTERY:  Antegrade flow with low resistance waveform.
IMPRESSION: Color duplex indicates minimal heterogeneous plaque, with no
hemodynamically significant stenosis by duplex criteria in the
extracranial cerebrovascular circulation.

## 2016-03-30 ENCOUNTER — Ambulatory Visit (HOSPITAL_COMMUNITY)
Admission: RE | Admit: 2016-03-30 | Discharge: 2016-03-30 | Disposition: A | Discharge status: Home / Self Care | Payer: 59 | Source: Ambulatory Visit | Attending: Gastroenterology | Admitting: Gastroenterology

## 2016-03-30 DIAGNOSIS — K5090 Crohn's disease, unspecified, without complications: Secondary | ICD-10-CM | POA: Insufficient documentation

## 2016-03-30 MED ORDER — DIPHENHYDRAMINE HCL 50 MG/ML IJ SOLN
INTRAMUSCULAR | Status: AC
  Filled 2016-03-30: qty 1

## 2016-03-30 MED ORDER — DIPHENHYDRAMINE HCL 50 MG/ML IJ SOLN
50.0000 mg | INTRAMUSCULAR | Status: AC
  Administered 2016-03-30: 50 mg via INTRAVENOUS

## 2016-03-30 MED ORDER — SODIUM CHLORIDE 0.9 % IV SOLN
800.0000 mg | INTRAVENOUS | Status: AC
  Administered 2016-03-30: 800 mg via INTRAVENOUS
  Filled 2016-03-30: qty 80

## 2016-03-30 MED ORDER — ACETAMINOPHEN 500 MG PO TABS
ORAL_TABLET | ORAL | Status: AC
  Filled 2016-03-30: qty 2

## 2016-03-30 MED ORDER — SODIUM CHLORIDE 0.9 % IV SOLN: INTRAVENOUS | Status: DC

## 2016-03-30 MED ORDER — ACETAMINOPHEN 500 MG PO TABS
1000.0000 mg | ORAL_TABLET | ORAL | Status: AC
  Administered 2016-03-30: 1000 mg via ORAL

## 2016-05-21 ENCOUNTER — Other Ambulatory Visit (HOSPITAL_COMMUNITY): Payer: Self-pay | Admitting: *Deleted

## 2016-05-25 ENCOUNTER — Ambulatory Visit (HOSPITAL_COMMUNITY)
Admission: RE | Admit: 2016-05-25 | Discharge: 2016-05-25 | Disposition: A | Discharge status: Home / Self Care | Payer: 59 | Source: Ambulatory Visit | Attending: Gastroenterology | Admitting: Gastroenterology

## 2016-05-25 DIAGNOSIS — K5090 Crohn's disease, unspecified, without complications: Secondary | ICD-10-CM | POA: Insufficient documentation

## 2016-05-25 MED ORDER — ACETAMINOPHEN 500 MG PO TABS
ORAL_TABLET | ORAL | Status: AC
  Administered 2016-05-25: 1000 mg
  Filled 2016-05-25: qty 2

## 2016-05-25 MED ORDER — ACETAMINOPHEN 500 MG PO TABS: 1000.0000 mg | ORAL_TABLET | Freq: Once | ORAL | Status: DC

## 2016-05-25 MED ORDER — SODIUM CHLORIDE 0.9 % IV SOLN
800.0000 mg | Freq: Once | INTRAVENOUS | Status: AC
  Administered 2016-05-25: 800 mg via INTRAVENOUS
  Filled 2016-05-25: qty 80

## 2016-05-25 MED ORDER — DIPHENHYDRAMINE HCL 50 MG/ML IJ SOLN: 50.0000 mg | Freq: Once | INTRAMUSCULAR | Status: DC

## 2016-05-25 MED ORDER — SODIUM CHLORIDE 0.9 % IV SOLN
Freq: Once | INTRAVENOUS | Status: AC
  Administered 2016-05-25: 11:00:00 via INTRAVENOUS

## 2016-05-25 MED ORDER — DIPHENHYDRAMINE HCL 50 MG/ML IJ SOLN
INTRAMUSCULAR | Status: AC
  Administered 2016-05-25: 50 mg
  Filled 2016-05-25: qty 1

## 2016-07-20 ENCOUNTER — Ambulatory Visit (HOSPITAL_COMMUNITY): Payer: 59

## 2019-02-20 ENCOUNTER — Ambulatory Visit: Payer: 59 | Admitting: Family Medicine

## 2019-02-20 ENCOUNTER — Ambulatory Visit (INDEPENDENT_AMBULATORY_CARE_PROVIDER_SITE_OTHER): Payer: 59 | Admitting: Family Medicine

## 2019-02-20 ENCOUNTER — Ambulatory Visit: Payer: Self-pay

## 2019-02-20 ENCOUNTER — Encounter: Payer: Self-pay | Admitting: Family Medicine

## 2019-02-20 ENCOUNTER — Other Ambulatory Visit: Payer: Self-pay

## 2019-02-20 DIAGNOSIS — M25522 Pain in left elbow: Secondary | ICD-10-CM | POA: Diagnosis not present

## 2019-02-20 DIAGNOSIS — M25521 Pain in right elbow: Secondary | ICD-10-CM

## 2019-02-20 NOTE — Progress Notes (Signed)
Isaac Mckenzie - 59 y.o. male MRN [id number]  Date of birth: 15-Jul-1960  Office Visit Note: Visit Date: 02/20/2019 PCP: System, Pcp Not In Referred by: No ref. provider found  Subjective: Chief Complaint  Patient presents with  . Right Elbow - Pain    Injured the elbow in the Spring of 2020, had xrays and was told there was a chip broken off. Had no problems until the elbow swelled approximately 2 & 1/2 months ago. No redness/warmth.  . Left Elbow - Pain    Noticed a hard lump in the left elbow about 2 & 1/2 months ago - no known injury.    HPI: Isaac Mckenzie is a 59 y.o. male who comes in today with right elbow pain amd swelling as well as a new lump in his left elbow.  Right elbow-- injury in Spring 2020 where he slammed his elbow into a wall when starting up a machine in a rowing motion. He initially had pain in elbow but it resolved the next day. He felt a loose body in his elbow that he thought was a bone chip but it went away after several months. 2.5 months ago, he started to have swelling in posterior elbow. It has reduced in size recently and feels less tense. No redness, fevers.  Left elbow-- has felt a bump in his elbow. Does not bother him. Does not cause any numbness or tingling down his arm. Non-painful. He has full motion in his elbow and loose body never feels like it catches.   Otherwise per HPI.  Assessment & Plan: Visit Diagnoses:  1. Pain in right elbow   2. Pain in left elbow     Plan:  Right elbow- olecranon bursa, drained 8cc of serous fluid today and injected steroid.   Left elbow- either small pea-size ganglion cyst or cartilaginous loose body. As it is not causing symptoms or neuropathic pain, will defer interventions at this time. Discussed with him that he can have it removed if it starts to press on his ulnar nerve or get caught in the joint.   Meds & Orders: No orders of the defined types were placed in this encounter.   Orders Placed This Encounter   Procedures  . XR Elbow 2 Views Right  . XR Elbow 2 Views Left    Follow-up: PRN  Procedures: Procedure performed: olecranon bursitis drainage  Noted no overlying erythema, induration, or other signs of local infection. The right posterior olecranon bursa was palpated and marked. The overlying skin was prepped in a sterile fashion. Topical analgesic spray: Ethyl chloride. Joint: right olecranon bursa Needle: 25 gauge, 1.5 inch for numbing, 18 gauge for draining Completed without difficulty. Injected 4 cc 1% lidocaine without epinephrine Drained 8cc of serous fluid, then injected 40 mg depo-medrol Placed ace wrap over elbow   Clinical History: No specialty comments available.   He reports that he has never smoked. He does not have any smokeless tobacco history on file. No results for input(s): HGBA1C, LABURIC in the last 8760 hours.  Objective:  VS:  HT:    WT:   BMI:     BP:   HR: bpm  TEMP: ( )  RESP:  Physical Exam  PHYSICAL EXAM: Gen: NAD, alert, cooperative with exam, well-appearing HEENT: clear conjunctiva,  CV:  no edema, capillary refill brisk, normal rate Resp: non-labored Skin: no rashes, normal turgor  Neuro: no gross deficits.  Psych:  alert and oriented  Ortho Exam  Right Elbow: - Inspection: large olecranon bursa, non-tender, non-erythematous - Palpation: No TTP - ROM: full active ROM in flexion and extension. No crepitus - Strength: 5/5 strength in wrist flexion and extension without pain. 5/5 strength in biceps, triceps. - Neuro: NV intact distally  Left Elbow: - Inspection: small, pea-size mobile nodule in cubital tunnel - Palpation: No TTP - ROM: full active ROM in flexion and extension. No crepitus - Strength: 5/5 strength in wrist flexion and extension without pain. 5/5 strength in biceps, triceps. - Neuro: NV intact distally - Special testing: Negative tinel's at cubital tunnel  Imaging: No results found.  Past  Medical/Family/Surgical/Social History: Medications & Allergies reviewed per EMR, new medications updated. Patient Active Problem List   Diagnosis Date Noted  . Leukocytosis, unspecified 02/08/2013  . Abdominal pain 02/06/2013  . Crohn's disease ([place]) 02/06/2013  . SIRS (systemic inflammatory response syndrome) ([place]) 02/06/2013   Past Medical History:  Diagnosis Date  . Crohn disease ([place])    History reviewed. No pertinent family history. Past Surgical History:  Procedure Laterality Date  . ABDOMINAL SURGERY     Social History   Occupational History  . Not on file  Tobacco Use  . Smoking status: Never Smoker  Substance and Sexual Activity  . Alcohol use: No  . Drug use: No  . Sexual activity: Not on file

## 2019-02-20 NOTE — Progress Notes (Signed)
I saw and examined the patient with Dr. Robby Sermon and agree with assessment and plan as outlined.    Referred by Dr. Lianne Cure for right elbow swelling and left elbow nodule.  X-Rays show possible old chip fracture of posterior aspect of right olecranon.  Left elbow x-rays look normal.  No sign of loose body.  Will aspirate and inject the right olecranon bursa today.  Reassurance regarding left elbow nodule, which feels like a ganglion cyst near the cubital tunnel.  Will ultrasound the left elbow if it becomes symptomatic.
